# Patient Record
Sex: Female | Born: 1965 | Race: White | Hispanic: No | Marital: Married | Smoking: Current every day smoker
Health system: Southern US, Community
[De-identification: ages and names within clinical notes are randomized; demographics above are authoritative.]

## PROBLEM LIST (undated history)

## (undated) DIAGNOSIS — M549 Dorsalgia, unspecified: Secondary | ICD-10-CM

## (undated) DIAGNOSIS — I471 Supraventricular tachycardia, unspecified: Secondary | ICD-10-CM

## (undated) DIAGNOSIS — I10 Essential (primary) hypertension: Secondary | ICD-10-CM

## (undated) DIAGNOSIS — I4891 Unspecified atrial fibrillation: Secondary | ICD-10-CM

## (undated) DIAGNOSIS — J189 Pneumonia, unspecified organism: Secondary | ICD-10-CM

## (undated) DIAGNOSIS — K5792 Diverticulitis of intestine, part unspecified, without perforation or abscess without bleeding: Secondary | ICD-10-CM

## (undated) HISTORY — PX: TUBAL LIGATION: SHX77

## (undated) HISTORY — PX: CHOLECYSTECTOMY: SHX55

---

## 2002-05-01 ENCOUNTER — Emergency Department (HOSPITAL_COMMUNITY): Admission: EM | Admit: 2002-05-01 | Discharge: 2002-05-01 | Payer: Self-pay | Admitting: Internal Medicine

## 2002-05-01 ENCOUNTER — Emergency Department (HOSPITAL_COMMUNITY): Admission: EM | Admit: 2002-05-01 | Discharge: 2002-05-01 | Payer: Self-pay | Admitting: *Deleted

## 2002-06-06 ENCOUNTER — Emergency Department (HOSPITAL_COMMUNITY): Admission: EM | Admit: 2002-06-06 | Discharge: 2002-06-06 | Payer: Self-pay | Admitting: *Deleted

## 2002-08-12 ENCOUNTER — Ambulatory Visit (HOSPITAL_COMMUNITY): Admission: RE | Admit: 2002-08-12 | Discharge: 2002-08-12 | Payer: Self-pay | Admitting: Family Medicine

## 2002-08-12 ENCOUNTER — Encounter: Payer: Self-pay | Admitting: Family Medicine

## 2002-08-16 ENCOUNTER — Emergency Department (HOSPITAL_COMMUNITY): Admission: EM | Admit: 2002-08-16 | Discharge: 2002-08-16 | Payer: Self-pay | Admitting: Emergency Medicine

## 2004-04-16 ENCOUNTER — Emergency Department (HOSPITAL_COMMUNITY): Admission: EM | Admit: 2004-04-16 | Discharge: 2004-04-16 | Payer: Self-pay | Admitting: *Deleted

## 2004-04-17 ENCOUNTER — Emergency Department (HOSPITAL_COMMUNITY): Admission: EM | Admit: 2004-04-17 | Discharge: 2004-04-18 | Payer: Self-pay | Admitting: *Deleted

## 2004-10-24 ENCOUNTER — Emergency Department (HOSPITAL_COMMUNITY): Admission: EM | Admit: 2004-10-24 | Discharge: 2004-10-24 | Payer: Self-pay | Admitting: Emergency Medicine

## 2005-03-17 ENCOUNTER — Emergency Department (HOSPITAL_COMMUNITY): Admission: EM | Admit: 2005-03-17 | Discharge: 2005-03-18 | Payer: Self-pay | Admitting: Emergency Medicine

## 2006-03-17 ENCOUNTER — Emergency Department (HOSPITAL_COMMUNITY): Admission: EM | Admit: 2006-03-17 | Discharge: 2006-03-17 | Payer: Self-pay | Admitting: Emergency Medicine

## 2007-01-24 ENCOUNTER — Inpatient Hospital Stay (HOSPITAL_COMMUNITY): Admission: EM | Admit: 2007-01-24 | Discharge: 2007-01-26 | Payer: Self-pay | Admitting: Emergency Medicine

## 2007-08-10 ENCOUNTER — Ambulatory Visit (HOSPITAL_COMMUNITY): Admission: RE | Admit: 2007-08-10 | Discharge: 2007-08-10 | Payer: Self-pay | Admitting: Orthopaedic Surgery

## 2008-01-03 ENCOUNTER — Emergency Department (HOSPITAL_COMMUNITY): Admission: EM | Admit: 2008-01-03 | Discharge: 2008-01-03 | Payer: Self-pay | Admitting: Emergency Medicine

## 2009-03-11 ENCOUNTER — Emergency Department (HOSPITAL_COMMUNITY): Admission: EM | Admit: 2009-03-11 | Discharge: 2009-03-11 | Payer: Self-pay | Admitting: Emergency Medicine

## 2010-05-02 ENCOUNTER — Emergency Department (HOSPITAL_COMMUNITY): Admission: EM | Admit: 2010-05-02 | Discharge: 2009-08-24 | Payer: Self-pay | Admitting: Emergency Medicine

## 2010-05-02 ENCOUNTER — Emergency Department (HOSPITAL_COMMUNITY): Admission: EM | Admit: 2010-05-02 | Discharge: 2009-11-01 | Payer: Self-pay | Admitting: Emergency Medicine

## 2010-05-14 ENCOUNTER — Emergency Department (HOSPITAL_COMMUNITY)
Admission: EM | Admit: 2010-05-14 | Discharge: 2010-05-15 | Payer: Self-pay | Source: Home / Self Care | Admitting: Emergency Medicine

## 2010-06-15 ENCOUNTER — Encounter: Payer: Self-pay | Admitting: *Deleted

## 2010-08-05 LAB — URINALYSIS, ROUTINE W REFLEX MICROSCOPIC
Bilirubin Urine: NEGATIVE
Glucose, UA: NEGATIVE mg/dL
Ketones, ur: NEGATIVE mg/dL
Leukocytes, UA: NEGATIVE
Nitrite: NEGATIVE
Protein, ur: NEGATIVE mg/dL
Specific Gravity, Urine: 1.025 (ref 1.005–1.030)
Urobilinogen, UA: 0.2 mg/dL (ref 0.0–1.0)
pH: 6 (ref 5.0–8.0)

## 2010-08-05 LAB — URINE MICROSCOPIC-ADD ON

## 2010-08-05 LAB — URINE CULTURE
Colony Count: NO GROWTH
Culture  Setup Time: 201112211125
Culture: NO GROWTH

## 2010-08-08 ENCOUNTER — Observation Stay (HOSPITAL_COMMUNITY)
Admission: EM | Admit: 2010-08-08 | Discharge: 2010-08-12 | Disposition: A | Discharge status: Home / Self Care | Payer: BC Managed Care – PPO | Attending: Internal Medicine | Admitting: Internal Medicine

## 2010-08-08 ENCOUNTER — Emergency Department (HOSPITAL_COMMUNITY): Payer: BC Managed Care – PPO

## 2010-08-08 DIAGNOSIS — T380X5A Adverse effect of glucocorticoids and synthetic analogues, initial encounter: Secondary | ICD-10-CM | POA: Insufficient documentation

## 2010-08-08 DIAGNOSIS — F17200 Nicotine dependence, unspecified, uncomplicated: Secondary | ICD-10-CM | POA: Insufficient documentation

## 2010-08-08 DIAGNOSIS — J209 Acute bronchitis, unspecified: Principal | ICD-10-CM | POA: Insufficient documentation

## 2010-08-08 DIAGNOSIS — R7309 Other abnormal glucose: Secondary | ICD-10-CM | POA: Insufficient documentation

## 2010-08-08 DIAGNOSIS — J440 Chronic obstructive pulmonary disease with acute lower respiratory infection: Principal | ICD-10-CM | POA: Insufficient documentation

## 2010-08-08 DIAGNOSIS — I10 Essential (primary) hypertension: Secondary | ICD-10-CM | POA: Insufficient documentation

## 2010-08-08 LAB — BLOOD GAS, ARTERIAL
Acid-Base Excess: 1.2 mmol/L (ref 0.0–2.0)
Bicarbonate: 25.2 mEq/L — ABNORMAL HIGH (ref 20.0–24.0)
FIO2: 21 %
O2 Saturation: 91.7 %
Patient temperature: 37
TCO2: 22.1 mmol/L (ref 0–100)
pCO2 arterial: 39.7 mmHg (ref 35.0–45.0)
pH, Arterial: 7.419 — ABNORMAL HIGH (ref 7.350–7.400)
pO2, Arterial: 62.4 mmHg — ABNORMAL LOW (ref 80.0–100.0)

## 2010-08-08 LAB — CBC
HCT: 42.1 % (ref 36.0–46.0)
Hemoglobin: 14.2 g/dL (ref 12.0–15.0)
MCH: 30.9 pg (ref 26.0–34.0)
MCHC: 33.7 g/dL (ref 30.0–36.0)
MCV: 91.7 fL (ref 78.0–100.0)
Platelets: 225 10*3/uL (ref 150–400)
RBC: 4.59 MIL/uL (ref 3.87–5.11)
RDW: 13.4 % (ref 11.5–15.5)
WBC: 15.4 10*3/uL — ABNORMAL HIGH (ref 4.0–10.5)

## 2010-08-08 LAB — BASIC METABOLIC PANEL
BUN: 10 mg/dL (ref 6–23)
CO2: 26 mEq/L (ref 19–32)
Calcium: 8.8 mg/dL (ref 8.4–10.5)
Chloride: 99 mEq/L (ref 96–112)
Creatinine, Ser: 0.57 mg/dL (ref 0.4–1.2)
GFR calc Af Amer: >60 mL/min (ref >60)
GFR calc non Af Amer: >60 mL/min (ref >60)
Glucose, Bld: 140 mg/dL — ABNORMAL HIGH (ref 70–99)
Potassium: 3.8 mEq/L (ref 3.5–5.1)
Sodium: 135 mEq/L (ref 135–145)

## 2010-08-09 LAB — DIFFERENTIAL
Basophils Absolute: 0 10*3/uL (ref 0.0–0.1)
Basophils Relative: 0 % (ref 0–1)
Eosinophils Absolute: 0.3 10*3/uL (ref 0.0–0.7)
Eosinophils Relative: 2 % (ref 0–5)
Lymphocytes Relative: 15 % (ref 12–46)
Lymphs Abs: 2.3 10*3/uL (ref 0.7–4.0)
Monocytes Absolute: 1.1 10*3/uL — ABNORMAL HIGH (ref 0.1–1.0)
Monocytes Relative: 7 % (ref 3–12)
Neutro Abs: 11.7 10*3/uL — ABNORMAL HIGH (ref 1.7–7.7)
Neutrophils Relative %: 76 % (ref 43–77)

## 2010-08-09 LAB — GLUCOSE, CAPILLARY
Glucose-Capillary: 267 mg/dL — ABNORMAL HIGH (ref 70–99)
Glucose-Capillary: 239 mg/dL — ABNORMAL HIGH (ref 70–99)
Glucose-Capillary: 227 mg/dL — ABNORMAL HIGH (ref 70–99)

## 2010-08-10 LAB — COMPREHENSIVE METABOLIC PANEL
ALT: 27 U/L (ref 0–35)
AST: 16 U/L (ref 0–37)
Albumin: 3.2 g/dL — ABNORMAL LOW (ref 3.5–5.2)
Alkaline Phosphatase: 104 U/L (ref 39–117)
BUN: 10 mg/dL (ref 6–23)
CO2: 27 mEq/L (ref 19–32)
Calcium: 9.1 mg/dL (ref 8.4–10.5)
Chloride: 104 mEq/L (ref 96–112)
Creatinine, Ser: 0.54 mg/dL (ref 0.4–1.2)
GFR calc Af Amer: >60 mL/min (ref >60)
GFR calc non Af Amer: >60 mL/min (ref >60)
Glucose, Bld: 164 mg/dL — ABNORMAL HIGH (ref 70–99)
Potassium: 4.5 mEq/L (ref 3.5–5.1)
Sodium: 139 mEq/L (ref 135–145)
Total Bilirubin: 0.3 mg/dL (ref 0.3–1.2)
Total Protein: 6.6 g/dL (ref 6.0–8.3)

## 2010-08-10 LAB — CBC
HCT: 43.9 % (ref 36.0–46.0)
Hemoglobin: 14.3 g/dL (ref 12.0–15.0)
MCH: 31.4 pg (ref 26.0–34.0)
MCHC: 32.6 g/dL (ref 30.0–36.0)
MCV: 96.5 fL (ref 78.0–100.0)
Platelets: 265 10*3/uL (ref 150–400)
RBC: 4.55 MIL/uL (ref 3.87–5.11)
RDW: 13.3 % (ref 11.5–15.5)
WBC: 21.7 10*3/uL — ABNORMAL HIGH (ref 4.0–10.5)

## 2010-08-10 LAB — DIFFERENTIAL
Basophils Absolute: 0 10*3/uL (ref 0.0–0.1)
Basophils Relative: 0 % (ref 0–1)
Eosinophils Absolute: 0 10*3/uL (ref 0.0–0.7)
Eosinophils Relative: 0 % (ref 0–5)
Lymphocytes Relative: 13 % (ref 12–46)
Lymphs Abs: 2.9 10*3/uL (ref 0.7–4.0)
Monocytes Absolute: 1.5 10*3/uL — ABNORMAL HIGH (ref 0.1–1.0)
Monocytes Relative: 7 % (ref 3–12)
Neutro Abs: 17.3 10*3/uL — ABNORMAL HIGH (ref 1.7–7.7)
Neutrophils Relative %: 80 % — ABNORMAL HIGH (ref 43–77)

## 2010-08-10 LAB — TSH: TSH: 0.289 u[IU]/mL — ABNORMAL LOW (ref 0.350–4.500)

## 2010-08-10 LAB — T4, FREE: Free T4: 0.95 ng/dL (ref 0.80–1.80)

## 2010-08-10 LAB — HEMOGLOBIN A1C
Hgb A1c MFr Bld: 6 % — ABNORMAL HIGH (ref <5.7)
Mean Plasma Glucose: 126 mg/dL — ABNORMAL HIGH (ref <117)

## 2010-08-10 LAB — GLUCOSE, CAPILLARY
Glucose-Capillary: 180 mg/dL — ABNORMAL HIGH (ref 70–99)
Glucose-Capillary: 232 mg/dL — ABNORMAL HIGH (ref 70–99)

## 2010-08-11 LAB — DIFFERENTIAL
Basophils Absolute: 0 10*3/uL (ref 0.0–0.1)
Eosinophils Absolute: 0 10*3/uL (ref 0.0–0.7)
Lymphocytes Relative: 19 % (ref 12–46)
Monocytes Relative: 8 % (ref 3–12)
Neutro Abs: 15.6 10*3/uL — ABNORMAL HIGH (ref 1.7–7.7)
Neutrophils Relative %: 73 % (ref 43–77)

## 2010-08-11 LAB — BASIC METABOLIC PANEL
CO2: 28 mEq/L (ref 19–32)
Calcium: 8.6 mg/dL (ref 8.4–10.5)
Chloride: 102 mEq/L (ref 96–112)
GFR calc Af Amer: >60 mL/min (ref >60)
Glucose, Bld: 148 mg/dL — ABNORMAL HIGH (ref 70–99)
Potassium: 3.9 mEq/L (ref 3.5–5.1)
Sodium: 137 mEq/L (ref 135–145)

## 2010-08-11 LAB — GLUCOSE, CAPILLARY
Glucose-Capillary: 245 mg/dL — ABNORMAL HIGH (ref 70–99)
Glucose-Capillary: 232 mg/dL — ABNORMAL HIGH (ref 70–99)

## 2010-08-11 LAB — CBC
HCT: 42.7 % (ref 36.0–46.0)
Hemoglobin: 13.8 g/dL (ref 12.0–15.0)
RBC: 4.42 MIL/uL (ref 3.87–5.11)
WBC: 21.3 10*3/uL — ABNORMAL HIGH (ref 4.0–10.5)

## 2010-08-12 LAB — URINALYSIS, ROUTINE W REFLEX MICROSCOPIC
Bilirubin Urine: NEGATIVE
Glucose, UA: NEGATIVE mg/dL
Leukocytes, UA: NEGATIVE
Nitrite: NEGATIVE
Specific Gravity, Urine: 1.025 (ref 1.005–1.030)
pH: 6 (ref 5.0–8.0)

## 2010-08-12 LAB — URINE CULTURE

## 2010-08-12 LAB — POCT PREGNANCY, URINE: Preg Test, Ur: NEGATIVE

## 2010-08-12 LAB — URINE MICROSCOPIC-ADD ON

## 2010-08-12 LAB — GLUCOSE, CAPILLARY: Glucose-Capillary: 110 mg/dL — ABNORMAL HIGH (ref 70–99)

## 2010-08-14 LAB — URINALYSIS, ROUTINE W REFLEX MICROSCOPIC
Leukocytes, UA: NEGATIVE
Protein, ur: NEGATIVE mg/dL
Specific Gravity, Urine: 1.02 (ref 1.005–1.030)
Urobilinogen, UA: 0.2 mg/dL (ref 0.0–1.0)

## 2010-08-14 LAB — URINE MICROSCOPIC-ADD ON

## 2010-08-14 LAB — URINE CULTURE

## 2010-08-29 LAB — URINALYSIS, ROUTINE W REFLEX MICROSCOPIC
Leukocytes, UA: NEGATIVE
Nitrite: NEGATIVE
Protein, ur: NEGATIVE mg/dL
Specific Gravity, Urine: >1.03 — ABNORMAL HIGH (ref 1.005–1.030)
Urobilinogen, UA: 0.2 mg/dL (ref 0.0–1.0)

## 2010-08-29 LAB — URINE MICROSCOPIC-ADD ON

## 2010-08-29 LAB — PREGNANCY, URINE: Preg Test, Ur: NEGATIVE

## 2010-09-05 NOTE — H&P (Signed)
Wendy Ramirez, Wendy Ramirez               ACCOUNT NO.:  [id number]  MEDICAL RECORD NO.:  [id number]           PATIENT TYPE:  LOCATION:                                 FACILITY:  PHYSICIAN:  Osvaldo Shipper, MD     DATE OF BIRTH:  [birth year]  DATE OF ADMISSION:  08/09/2010 DATE OF DISCHARGE:  LH                             HISTORY & PHYSICAL   PRIMARY CARE PHYSICIAN:  Social research officer, government.  ADMISSION DIAGNOSES: 1. Acute bronchitis. 2. Chronic back pain.  CHIEF COMPLAINT:  Cough, shortness of breath since Sunday.  HISTORY OF PRESENT ILLNESS:  The patient is a 45 year old obese Caucasian female, who smokes, who does not have any other medical issues except for chronic back pain, who was in her usual state of health until this past weekend and she started having flu-like symptoms with runny nose, cough, body aches and then on Sunday, she started having shortness of breath with wheezing, had a cough with tan-colored expectoration. Denies any blood.  She had some sweating but is unsure if she had any fever.  Denies any sick contacts or any travel outside this area. Because of the coughing and the shortness of breath, she also experienced some chest tightness.  She also has been wheezing quite profusely.  With the breathing treatments given in the ED, she is feeling some better.  MEDICATIONS AT HOME:  Vicodin and Soma as needed.  ALLERGIES:  No known drug allergies.  PAST MEDICAL HISTORY:  Positive for: 1. Chronic back pain. 2. C-section. 3. Cholecystectomy.  SOCIAL HISTORY:  Lives in [place], smokes one pack of cigarettes on a daily basis.  No alcohol use.  No illicit drug use.  Currently unemployed.  FAMILY HISTORY:  Positive for unspecified heart disease.  REVIEW OF SYSTEMS:  GENERAL:  Positive for weakness, malaise.  HEENT: Unremarkable.  CARDIOVASCULAR:  As in HPI.  RESPIRATORY:  As in HPI. GASTROINTESTINAL:  Unremarkable.  GENITOURINARY:  Unremarkable. NEUROLOGIC:   Unremarkable.  PSYCHIATRIC:  Unremarkable.  DERMATOLOGIC: Unremarkable.  Other systems reviewed and found to be negative.  PHYSICAL EXAMINATION:  VITAL SIGNS:  Temperature 98.9; blood pressure 139/70; heart rate 113; respiratory rate 20; saturation 90% on room air, it was 88% on room air as well. GENERAL:  Morbidly obese white female in no distress. HEENT:  Head is normocephalic, atraumatic.  Pupils are equal and reacting.  No pallor, no icterus.  Oral mucous membranes moist.  No oral lesions are noted. NECK:  Soft and supple.  No thyromegaly appreciated.  No cervical, supraclavicular, or inguinal lymphadenopathy is present. LUNGS:  Diffuse end-expiratory wheezing bilaterally.  No crackles are present. CARDIOVASCULAR:  S1 and S2 are normal, regular.  No S3, S4, rubs, murmurs, or bruits. ABDOMEN:  Soft, nontender, nondistended.  Bowel sounds are present.  No masses or organomegaly is appreciated. EXTREMITIES:  No pedal edema is noted. NEUROLOGIC:  She is alert and oriented x3.  No focal neurological deficits are present. SKIN:  Does not reveal any rashes.  LABORATORY DATA:  Her ABG showed a pH of 7.41, pCO2 is 39, pO2 is 62, bicarb is 25, saturation 91%, this  was on room air.  White cell count is 15.4, hemoglobin is 14.2, platelet count is 225.  Electrolytes are normal.  Glucose was 140.  Chest x-ray was done which showed no active cardiopulmonary disease.  ASSESSMENT:  This is a 45 year old Caucasian female with no medical history except for chronic back pain who presents with wheezing, shortness of breath, cough for the past 3-4 days.  This is most likely acute bronchitis.  She unfortunately smokes.  PLAN: 1. Acute bronchitis will be treated with nebulizer treatments,     steroids, and azithromycin.  The smoking cessation will be     emphasized. 2. Tobacco abuse.  Nicotine patch will be utilized. 3. Chronic back pain.  Continue Vicodin as needed. 4. Leukocytosis, etiology  unclear.  We will recheck in the morning.     She is afebrile.  DVT prophylaxis will be initiated.  Further management and decisions will depend on results of further testing and patient's response to treatment.  Please also note that we have put the patient on antitussive agents.     Osvaldo Shipper, MD     GK/MEDQ  D:  08/09/2010  T:  08/09/2010  Job:  [id number]  cc:   Corrie Mckusick, M.D. Fax: [phone]  Electronically Signed by Osvaldo Shipper MD on 09/05/2010 11:00:29 PM

## 2010-09-11 NOTE — Discharge Summary (Signed)
NAMEJAYA, Wendy Ramirez               ACCOUNT NO.:  [id number]  MEDICAL RECORD NO.:  [id number]          PATIENT TYPE:  LOCATION:                                 FACILITY:  PHYSICIAN:  Annslee Tercero L. Lendell Caprice, MDDATE OF BIRTH:  1965-08-27  DATE OF ADMISSION:  08/08/2010 DATE OF DISCHARGE:  03/19/2012LH                              DISCHARGE SUMMARY   PRIMARY CARE PHYSICIAN:  Social research officer, government.  DISCHARGE DIAGNOSES: 1. Acute bronchitis with chronic obstructive pulmonary disease     exacerbation. 2. Hypertension. 3. Steroid-induced hyperglycemia. 4. Tobacco abuse. 5. Morbid obesity.  DISCHARGE MEDICATIONS: 1. Ambien 5 mg tab 1 tablet p.o. at bedtime p.r.n. insomnia dispense     10. 2. Zithromax 250 mg tab p.o. daily x1 more day to complete course. 3. Prednisone taper. 4. Hydrocodone -- APAP 7.5/325 one tablet p.o. q.4 h. p.r.n. pain. 5. Soma 350 mg tab 1 tablet p.o. t.i.d. p.r.n. muscle spasm.  BRIEF HOSPITAL COURSE:  Wendy Ramirez is a 45 year old morbidly obese female who has history of chronic back pain and chronic tobacco abuse who presented to the emergency department on day of admission with complaints of cough and shortness of breath.  The patient described flu- like symptoms with runny nose, cough and body aches.  Upon evaluation in the emergency department, the patient found to have significant wheezing which was improved with nebulizer treatments.  Given persistence of the patient's symptoms, she was admitted by [facility] for further evaluation and treatment.  DIAGNOSTIC STUDIES:  Chest x-ray performed on August 08, 2010 showing no active disease.  CONSULTATIONS:  None.  PERTINENT LABS:  At time of discharge, white cell count 21.3 down from 21.7, platelet count 271, hemoglobin 13.8, hematocrit 42.7.  Sodium 137, potassium 3.9, chloride 102, CO2 of 28, BUN 15, creatinine 0.57, TSH 0.289, free T4 within normal limits.  Hemoglobin A1c 6.0.  COURSE OF  HOSPITALIZATION: 1. Acute bronchitis with chronic obstructive pulmonary disease     exacerbation.  The patient was admitted from the emergency     department and placed on IV Solu-Medrol and azithromycin.  The     patient has since been tapered to p.o. prednisone.  She has     tolerated taper well with no wheezing at time of discharge     evaluation.  The patient is instructed to continue prednisone taper     as prescribed as well as complete 5-day course of azithromycin.     The patient has been counseled extensively on smoking cessation.     She has expressed interest in nicotine patch, which can be brought     over-the-counter. 2. Hypertension.  The patient's blood pressure remained relatively     stable throughout hospitalization.  She did have slight elevation     in blood pressure on day of disposition. The patient has been asked     to follow up in 1-2 weeks per primary care physician for recheck of     blood pressure and determine need for treatment. 3. Steroid-induced hyperglycemia.  As mentioned above, hemoglobin A1c     during hospitalization is 6.0.  The patient  was maintained on     sliding scale insulin throughout hospitalization.  No need for     treatment at time of discharge.  DISPOSITION:  The patient is felt medically stable for discharge home at this time.  Again, the patient has been discharged with steroid taper and p.o. Zithromax.  The patient is instructed to resume heart-healthy diet.  She is asked to follow with primary care physician in 1-2 weeks for recheck of acute bronchitis as well as recheck of blood pressure. The patient verifies understanding of discharge instructions.     Cordelia Pen, NP   ______________________________ Hedy Camara. Lendell Caprice, MD    LE/MEDQ  D:  08/12/2010  T:  08/12/2010  Job:  [id number]  cc:   [facility]  Electronically Signed by Cordelia Pen NP on 08/16/2010 10:35:15 AM Electronically Signed by  Crista Curb MD on 09/11/2010 09:27:25 PM

## 2010-10-08 NOTE — H&P (Signed)
NAME:  Wendy Ramirez, Wendy Ramirez               ACCOUNT NO.:  [id number]   MEDICAL RECORD NO.:  [id number]          PATIENT TYPE:  INP   LOCATION:  A332                          FACILITY:  [facility]   PHYSICIAN:  Catalina Pizza, M.D.        DATE OF BIRTH:  Oct 10, 1965   DATE OF ADMISSION:  01/24/2007  DATE OF DISCHARGE:  LH                              HISTORY & PHYSICAL   PRIMARY DOCTOR:  [facility]   CHIEF COMPLAINT:  Abdominal pain, low internal.   HISTORY OF PRESENT ILLNESS:  Ms. Slappey is a morbidly obese 45 year old  white female who stated that she began having abdominal pain  approximately two days prior to coming into the emergency department,  relatively constant, deep type pain.  Did not have any relief with any  routine medications.  She relates the pain as being suprapubic area.  She was seen by the emergency room physicians and had full workup with a  mildly elevated white count and CT which revealed findings consistent  with diverticulitis.  Per her report, she states that she had the same  type pain approximately six months ago and received some antibiotics  which did relieve all of her symptoms at that time.  She has not had any  other significant complaints at this time.  Other than that, no problems  with bowel or bladder, although she states she always has loose type  stools anyway.  Has not noticed any blood in her stools, black tarry  stools.   PAST MEDICAL HISTORY:  Significant for chronic lower back pain.  She has  had a gallbladder surgery before as well as tonsillectomy.   SOCIAL HISTORY:  She has four children, takes care of them.  She does  smoke approximately one pack per day.  She quit for eight years but  resumed smoking again recently.  Does not drink alcohol or any other  drugs.   FAMILY HISTORY:  Father died at age 54 of cirrhosis and heart disease.   Mother is 36.  She does have history of kidney stones and history of hip  replacement.   Has one brother and  four sisters.  One brother is deceased from a car  accident.  No other health problems in brother and sister.   REVIEW OF SYSTEMS:  The patient does not notice any specific fever or  chills recently but does note the lower abdominal pain as in the history  of present illness.  Denies any chest pains.  No problems with her  breathing.  No problems with urinary tract symptoms.  She denies any  vaginal bleeding or vaginal pain.  No joint pain specifically other than  some back pain which is chronic.  No other rashes apparent.   MEDICATIONS:  She is on Celebrex 100 mg p.o. daily which she takes  p.r.n. and Vicodin 7.5/500 q.6h. p.r.n. for pain.   ALLERGIES:  NO KNOWN DRUG ALLERGIES.   OBJECTIVE FINDINGS:  VITALS:  Initial temperature was 99, blood pressure  116/66, pulse 112, respirations 16, satting 96% on room air.  GENERAL: This is  a morbidly obese white female lying in bed in no acute  distress.  HEENT:  Unremarkable.  Pupils equal and react to light accommodation.  No scleral icterus.  NECK:  Supple.  HEART:  Regular rate and rhythm.  No murmurs, gallops, or rubs.  LUNGS: Clear to auscultation bilaterally.  ABDOMEN:  Soft, nontender.  Unable to palpate pain that she is having  even in the suprapubic area.  EXTREMITIES:  No lower extremity edema.  NEUROLOGIC:  Alert and oriented x3 and no deficits noted.   LAB WORK ON ADMISSION:  UA shows some dehydration with specific gravity  of greater than 1.030, but otherwise negative.  A urine micro showed 7  out of 10 WBCs and RBCs and few bacteria.  Pregnancy was negative.  CBC  showed white count of 15.6, hemoglobin 14.6, platelet count of 247 with  left shift, 81% neutrophils.  B-met shows sodium 137, potassium 4.3,  chloride 105, CO2 26, glucose 100, BUN 10, creatinine 0.59, calcium of  8.6.  Wet prep was done which showed no yeast.  No Trichomonas but many  clue cells, many WBCs.   CT scan noted to have inflammatory changes about  sigmoid colon  suggesting diverticulitis.  Also did note a 6.8-cm left ovarian cystic  mass which is similar to a study in 2005, but I do not believe that  there is any interval follow-up from this.   IMPRESSION:  This is a 45 year old morbidly obese white female who  appears to have some simple diverticulitis type pain.  No significant  signs of bleeding or abscess on CT scan.   ASSESSMENT/PLAN:  1. Diverticulitis.  Cover with Flagyl and Cipro IV for now, and we      will transition over to oral medicines.  She did have elevated      white count at 15,000.  We will recheck CBC, make sure that this is      coming down, and assess her pain.  We will continue pain medication      IV for now.  I do not feel that a GI consult is necessary at this      time.  2. Bacterial vaginosis.  She did have significant clue cells, and the      Flagyl which she is getting, should cover this as well.  3. Left ovarian cyst/tumor.  We will get OB/GYN to see the patient to      assure that she gets routine follow-up for this since this has been      present for the last two years.  Unclear what kind of monitoring      need be done, whether surgery needs to be performed to further      assess this.  4. Morbid obesity.  Did discuss with the patient that risk factors of      having that much weight and problems that it can cause.  5. Tobacco abuse.  She continues to smoke.  I did talk to her about      quitting smoking at this time.   DISPOSITION:  We will continue with IV antibiotics for now and reassess  in the morning to see if any further tests need to be done.      Catalina Pizza, M.D.  Electronically Signed    ZH/MEDQ  D:  01/25/2007  T:  01/25/2007  Job:  [id number]

## 2010-10-08 NOTE — Discharge Summary (Signed)
NAMEMALEIGHA, Wendy Ramirez               ACCOUNT NO.:  [id number]   MEDICAL RECORD NO.:  [id number]          PATIENT TYPE:  INP   LOCATION:  A332                          FACILITY:  [facility]   PHYSICIAN:  Osvaldo Shipper, MD     DATE OF BIRTH:  [birth year]   DATE OF ADMISSION:  01/24/2007  DATE OF DISCHARGE:  09/02/2008LH                               DISCHARGE SUMMARY   Please see H&P dictated by Dr. Catalina Pizza for details regarding patient's  presenting illness.   PMD:  [employer].   DISCHARGE DIAGNOSES:  1. Acute diverticulitis, improved.  2. Left ovarian cyst requiring gynecologic followup.  3. Bacterial vaginosis, stable.  4. History of morbid obesity.   BRIEF HOSPITAL COURSE:  Briefly, this is a 45 year old Caucasian female  who presented with complaints of abdominal pain.  There was no history  of any nausea.  No diarrhea was present.  She underwent a CAT scan which  showed evidence for diverticulitis.   Patient was admitted to the hospital and put on Cipro and Flagyl.  Her  pain is improved, although it still persists.  She did have an elevated  white count of 15,000, which has come down to 9000 today.  The rest of  her labs are completely unremarkable.  Hence, from this perspective, the  patient is considered stable to be discharged.   Ovarian cyst was also noted on the CAT scan.  This was on the left side.  It was stable, compared to a previous study from 2005.  In order to  further evaluate this, we have consulted Dr. Emelda Fear.  He has ordered a  pelvic ultrasound on her today, the report of which is pending.  I think  these issues can be sorted out as an outpatient if it is okay with Dr.  Emelda Fear.  She can definitely follow up with him as an outpatient.  He  is going to see this patient this afternoon, and if okay with him, we  will go ahead and discharge the patient.   Bacterial vaginosis will be treated by the Flagyl that she will on.   Weight loss counseling and  smoking cessation counseling was provided by  the patient.   DISCHARGE MEDICATIONS:  1. She may continue Celebrex 10 mg daily.  2. She has been asked to stop Vicodin for now and instead be on      Percocet 5/325 1-2 tablets as needed every 4 hours for pain, #20      tablets have been prescribed.  3. Cipro 500 mg p.o. b.i.d. x12 days.  4. Flagyl 500 mg 3 times daily x12 days.   Follow up with [facility] in one months' time.  Patient may need  a referral to GI at that point to see if she needs a colonoscopy.  She  will also likely need a followup with Dr. Emelda Fear for this ovarian  cyst.   DIET:  Low fat, high fiber diet.   PHYSICAL ACTIVITY:  No restrictions.   IMAGING STUDIES:  CAT scan and ultrasound, discussed above.  The pelvic  ultrasound report is  still pending.   Total time of discharge 35 minutes.      Osvaldo Shipper, MD  Electronically Signed     GK/MEDQ  D:  01/26/2007  T:  01/26/2007  Job:  [id number]   cc:   Tilda Burrow, M.D.  Fax: [phone]   Patrica Duel, M.D.  Fax: [phone]

## 2010-10-08 NOTE — Consult Note (Signed)
Wendy Ramirez, Wendy Ramirez               ACCOUNT NO.:  [id number]   MEDICAL RECORD NO.:  [id number]          PATIENT TYPE:  INP   LOCATION:  A332                          FACILITY:  [facility]   PHYSICIAN:  Tilda Burrow, M.D. DATE OF BIRTH:  1965-06-10   DATE OF CONSULTATION:  01/26/2007  DATE OF DISCHARGE:                                 CONSULTATION   ADMISSION DIAGNOSES:  Diverticular disease, left ovarian tumor, stable.   HISTORY OF PRESENT ILLNESS:  This morbidly obese 45 year old female is  admitted as described by Dr. Catalina Pizza for diverticular disease after  being seen in the Emergency Room and admitted when placed on IV Flagyl  and Cipro with conversion to oral medications.  The patient was noted  during admitting CT scan to have a persistent 7 cm left ovarian cyst  essentially unchanged from 2005.   GYN history is interesting.  The patient is a morbidly obese, gravida 4,  para 4, status post tubal ligation.  Many years ago she had an episode  of severe anemia with admission for D&C due to hemoglobin of 3.7.  She  has had D&C on two occasions by Dr. Lisette Grinder during his care for the  patient.  She has had no bleeding problems in several years and has had  no GYN care of course during that time.  She said last Pap smear was  several years ago.   SOCIAL HISTORY:  She has four children.  She smokes one pack per day.  Denies recreational drugs.   FAMILY HISTORY:  Father died of cirrhosis and heart disease.  Mother  alive and well with various medical problems.   MEDICATIONS:  1. Celebrex 100 mg p.o. daily.  2. Vicodin 7.5/500 q. 6 hours p.r.n. pain.   At the time of this visit on 01/26/07, the patient is doing quite well and  is ready for discharge.  CA125 has been ordered and is pending at the  time of this dictation.  She has had a pelvic ultrasound this a.m. which  shows a 7 cm cyst essentially unchanged from 2005.  The patient denies  any symptoms at this point in time relate to  the cyst.  Nonetheless we  will follow her up in four weeks.  Some consideration will be given to  surgical removal of this  persistent cyst.  The patient may choose to have hysterectomy and  removal of ovary at the same time due to her history of fleeting  abnormalities.  Pap smear of course will be necessary along with GC and  Chlamydia culture at follow-up visit in our office in two to four weeks.      Tilda Burrow, M.D.  Electronically Signed     JVF/MEDQ  D:  01/26/2007  T:  01/26/2007  Job:  [id number]   cc:   Catalina Pizza, M.D.  Fax: [phone]   [facility]

## 2010-10-08 NOTE — Group Therapy Note (Signed)
NAME:  Wendy Ramirez, Wendy Ramirez               ACCOUNT NO.:  [id number]   MEDICAL RECORD NO.:  [id number]          PATIENT TYPE:  INP   LOCATION:  A332                          FACILITY:  [facility]   PHYSICIAN:  Catalina Pizza, M.D.        DATE OF BIRTH:  1965/10/08   DATE OF PROCEDURE:  DATE OF DISCHARGE:                                 PROGRESS NOTE   SUBJECTIVE:  Wendy Ramirez was admitted for diverticulitis with lower  abdominal pain.  She states that the pain medication that she has been  getting IV has worked well for her.  She states she has been continued  on a clear liquid diet since last night and tolerated it well without  any difficulty.  She rates the pain at approximately 4/10 at its worst.   PHYSICAL EXAMINATION:  VITAL SIGNS:  Temperature 98, blood pressure  109/67, pulse 113, respirations 18, O2 saturation 95% on room air.  GENERAL:  This is a morbidly obese white female in no acute distress.  HEENT:  Unremarkable.  LUNGS:  Clear to auscultation bilaterally.  HEART:  Regular rate and rhythm.  No murmurs, gallops or rubs.  ABDOMEN:  Soft, nontender, positive bowel sounds but very protuberant.  EXTREMITIES:  No lower extremity edema.  NEUROLOGIC:  No deficits noted.   LABORATORY DATA:  The CBC showed a white count of 13.1, hemoglobin 13.1,  platelet count of 200.  Basic metabolic panel was normal except mildly  elevated glucose at 117.   IMPRESSION:  This is a 45 year old morbidly obese white female with what  appeared to be diverticulitis on IV Flagyl and Cipro at this time.   ASSESSMENT/PLAN:  1. Diverticulitis:  Will continue with IV Flagyl and Cipro through      today and transition over to oral medicines tomorrow.  We will go      ahead an transition to oral pain medicines today from the Dilaudid      and see if she can tolerate this to help out with pain portion of      it.  She will likely need at least 10-14 days of antibiotics to      help with this.  2. Ovarian cyst/tumor:   Will get OB/GYN to consult on the patient to      at least assure that she has follow up for this lesion which she      has had approximately for the last 2-3 years.  I am unclear whether      this has remained stable but whether this needs to be worked up      further.  Defer to the consultant.  3. Tobacco abuse:  Counseled again on the patient quitting smoking.   DISPOSITION:  If tolerates pain medicine all right and recheck of  laboratory work in the morning is okay, the patient may be amenable to  discharge with follow up with her routine doctors at [facility].      Catalina Pizza, M.D.  Electronically Signed     ZH/MEDQ  D:  01/25/2007  T:  01/25/2007  Job:  [id number]

## 2010-11-14 ENCOUNTER — Emergency Department (HOSPITAL_COMMUNITY): Payer: BC Managed Care – PPO

## 2010-11-14 ENCOUNTER — Inpatient Hospital Stay (HOSPITAL_COMMUNITY)
Admission: EM | Admit: 2010-11-14 | Discharge: 2010-11-16 | DRG: 181 | Disposition: A | Discharge status: Home / Self Care | Payer: BC Managed Care – PPO | Attending: General Surgery | Admitting: General Surgery

## 2010-11-14 DIAGNOSIS — K56609 Unspecified intestinal obstruction, unspecified as to partial versus complete obstruction: Principal | ICD-10-CM | POA: Diagnosis present

## 2010-11-14 LAB — URINALYSIS, ROUTINE W REFLEX MICROSCOPIC
Bilirubin Urine: NEGATIVE
Ketones, ur: NEGATIVE mg/dL
Nitrite: NEGATIVE
Urobilinogen, UA: 0.2 mg/dL (ref 0.0–1.0)

## 2010-11-14 LAB — COMPREHENSIVE METABOLIC PANEL
Alkaline Phosphatase: 106 U/L (ref 39–117)
BUN: 11 mg/dL (ref 6–23)
Calcium: 10.2 mg/dL (ref 8.4–10.5)
Creatinine, Ser: 0.54 mg/dL (ref 0.50–1.10)
GFR calc Af Amer: >60 mL/min (ref >60)
Glucose, Bld: 101 mg/dL — ABNORMAL HIGH (ref 70–99)
Potassium: 4 mEq/L (ref 3.5–5.1)
Total Protein: 7.9 g/dL (ref 6.0–8.3)

## 2010-11-14 LAB — DIFFERENTIAL
Lymphocytes Relative: 23 % (ref 12–46)
Lymphs Abs: 2.7 10*3/uL (ref 0.7–4.0)
Monocytes Absolute: 0.7 10*3/uL (ref 0.1–1.0)
Monocytes Relative: 6 % (ref 3–12)
Neutro Abs: 7.7 10*3/uL (ref 1.7–7.7)

## 2010-11-14 LAB — LIPASE, BLOOD: Lipase: 35 U/L (ref 11–59)

## 2010-11-14 LAB — CBC
HCT: 46.8 % — ABNORMAL HIGH (ref 36.0–46.0)
Hemoglobin: 15.9 g/dL — ABNORMAL HIGH (ref 12.0–15.0)
MCH: 31.7 pg (ref 26.0–34.0)
MCHC: 34 g/dL (ref 30.0–36.0)
MCV: 93.4 fL (ref 78.0–100.0)

## 2010-11-14 MED ORDER — IOHEXOL 300 MG/ML  SOLN
120.0000 mL | Freq: Once | INTRAMUSCULAR | Status: AC | PRN
  Administered 2010-11-14: 120 mL via INTRAVENOUS

## 2010-11-15 LAB — BASIC METABOLIC PANEL
GFR calc Af Amer: >60 mL/min (ref >60)
GFR calc non Af Amer: >60 mL/min (ref >60)
Potassium: 4 mEq/L (ref 3.5–5.1)
Sodium: 138 mEq/L (ref 135–145)

## 2010-11-15 LAB — CBC
Hemoglobin: 14.1 g/dL (ref 12.0–15.0)
MCHC: 32.3 g/dL (ref 30.0–36.0)
RDW: 13.5 % (ref 11.5–15.5)

## 2010-11-15 LAB — DIFFERENTIAL
Basophils Absolute: 0 10*3/uL (ref 0.0–0.1)
Basophils Relative: 0 % (ref 0–1)
Monocytes Absolute: 0.8 10*3/uL (ref 0.1–1.0)
Neutro Abs: 10.2 10*3/uL — ABNORMAL HIGH (ref 1.7–7.7)

## 2010-12-29 ENCOUNTER — Emergency Department (HOSPITAL_COMMUNITY)
Admission: EM | Admit: 2010-12-29 | Discharge: 2010-12-30 | Disposition: A | Discharge status: Home / Self Care | Payer: BC Managed Care – PPO | Attending: Emergency Medicine | Admitting: Emergency Medicine

## 2010-12-29 ENCOUNTER — Encounter: Payer: Self-pay | Admitting: *Deleted

## 2010-12-29 DIAGNOSIS — I880 Nonspecific mesenteric lymphadenitis: Secondary | ICD-10-CM | POA: Insufficient documentation

## 2010-12-29 DIAGNOSIS — Z87891 Personal history of nicotine dependence: Secondary | ICD-10-CM | POA: Insufficient documentation

## 2010-12-29 DIAGNOSIS — I889 Nonspecific lymphadenitis, unspecified: Secondary | ICD-10-CM

## 2010-12-29 HISTORY — DX: Diverticulitis of intestine, part unspecified, without perforation or abscess without bleeding: K57.92

## 2010-12-29 HISTORY — DX: Pneumonia, unspecified organism: J18.9

## 2010-12-29 HISTORY — DX: Dorsalgia, unspecified: M54.9

## 2010-12-29 NOTE — ED Notes (Signed)
Pt reports multiple areas swelling in her head and neck starting 2 days ago

## 2010-12-30 MED ORDER — CEPHALEXIN 500 MG PO CAPS: 500.0000 mg | ORAL_CAPSULE | Freq: Four times a day (QID) | ORAL | Status: AC

## 2010-12-30 MED ORDER — SULFAMETHOXAZOLE-TRIMETHOPRIM 800-160 MG PO TABS: 1.0000 | ORAL_TABLET | Freq: Two times a day (BID) | ORAL | Status: AC

## 2010-12-30 MED ORDER — SULFAMETHOXAZOLE-TMP DS 800-160 MG PO TABS
1.0000 | ORAL_TABLET | Freq: Once | ORAL | Status: AC
  Administered 2010-12-30: 1 via ORAL
  Filled 2010-12-30: qty 1

## 2010-12-30 MED ORDER — CEPHALEXIN 500 MG PO CAPS
1000.0000 mg | ORAL_CAPSULE | Freq: Once | ORAL | Status: AC
  Administered 2010-12-30: 1000 mg via ORAL
  Filled 2010-12-30: qty 2

## 2010-12-30 MED ORDER — ONDANSETRON HCL 4 MG PO TABS
4.0000 mg | ORAL_TABLET | Freq: Once | ORAL | Status: AC
  Administered 2010-12-30: 4 mg via ORAL
  Filled 2010-12-30: qty 1

## 2010-12-30 MED ORDER — ONDANSETRON HCL 4 MG PO TABS: 4.0000 mg | ORAL_TABLET | Freq: Four times a day (QID) | ORAL | Status: AC

## 2010-12-30 NOTE — ED Notes (Signed)
H.Bryant in room with pt

## 2010-12-30 NOTE — ED Provider Notes (Signed)
History     CSN: [id number] Arrival date & time: 12/29/2010 11:19 PM  Chief Complaint  Patient presents with  . Lymphadenopathy   HPI Comments: Pt reports 2 lesions in the scalp since 8/3. She now has increasing lymph nodes through out the scalp area. Some are tender. She is not sure of scratching the scalp. Use uses hair dye and relaxers, but states she is using the same product as always. She denies fever, nausea, or fatigue. No reported drainage from any of the lesions of the scalp. The areas are not improved by anything. They are worsened by  Palpation. Pt has not taken any antibiotics or seen a MD concerning this problem.  The history is provided by the patient.    Past Medical History  Diagnosis Date  . Diverticulitis   . Pneumonia   . Back pain     Past Surgical History  Procedure Date  . Cholecystectomy     No family history on file.  History  Substance Use Topics  . Smoking status: Former Games developer  . Smokeless tobacco: Not on file  . Alcohol Use:     OB History    Grav Para Term Preterm Abortions TAB SAB Ect Mult Living                  Review of Systems  Constitutional: Negative for activity change.       All ROS Neg except as noted in HPI  HENT: Negative for nosebleeds and neck pain.   Eyes: Negative for photophobia and discharge.  Respiratory: Negative for cough, shortness of breath and wheezing.   Cardiovascular: Negative for chest pain and palpitations.  Gastrointestinal: Negative for abdominal pain and blood in stool.  Genitourinary: Negative for dysuria, frequency and hematuria.  Musculoskeletal: Negative for back pain and arthralgias.  Skin: Negative.   Neurological: Negative for dizziness, seizures and speech difficulty.  Psychiatric/Behavioral: Negative for hallucinations and confusion.    Physical Exam  BP 151/71  Pulse 105  Temp(Src) 98.2 F (36.8 C) (Oral)  Resp 18  Ht 5\' 7"  (1.702 m)  Wt 300 lb (136.079 kg)  BMI 46.99 kg/m2  SpO2 98%   LMP 12/26/2010  Physical Exam  Nursing note and vitals reviewed. Constitutional: She is oriented to person, place, and time. She appears well-developed and well-nourished.  Non-toxic appearance.  HENT:  Right Ear: Tympanic membrane and external ear normal.  Left Ear: Tympanic membrane and external ear normal.       2 palpable lesion of the right lateral scalp and occipital area. Multiple swollen nodes about the hairline and behind the ears.  Eyes: EOM and lids are normal. Pupils are equal, round, and reactive to light.  Neck: Normal range of motion. Neck supple. Carotid bruit is not present.  Cardiovascular: Normal rate, regular rhythm, normal heart sounds, intact distal pulses and normal pulses.   Pulmonary/Chest: Breath sounds normal. No respiratory distress.  Abdominal: Soft. Bowel sounds are normal. There is no tenderness. There is no guarding.  Musculoskeletal: Normal range of motion.  Lymphadenopathy:       Head (right side): No submandibular adenopathy present.       Head (left side): No submandibular adenopathy present.    She has no cervical adenopathy.  Neurological: She is alert and oriented to person, place, and time. She has normal strength. No cranial nerve deficit or sensory deficit.  Skin: Skin is warm and dry.  Psychiatric: She has a normal mood and affect. Her speech is  normal.    ED Course  Procedures  MDM I have reviewed nursing notes, vital signs, and all appropriate lab and imaging results for this patient.      Kathie Dike, [place] 12/30/10 0021 Medical screening examination/treatment/procedure(s) were performed by non-physician practitioner and as supervising physician I was immediately available for consultation/collaboration.  Nicoletta Dress. Colon Branch, MD 01/01/11 1207

## 2011-02-21 LAB — URINALYSIS, ROUTINE W REFLEX MICROSCOPIC
Leukocytes, UA: NEGATIVE
Urobilinogen, UA: 0.2

## 2011-02-21 LAB — PREGNANCY, URINE: Preg Test, Ur: NEGATIVE

## 2011-02-21 LAB — URINE MICROSCOPIC-ADD ON

## 2011-02-21 LAB — URINE CULTURE
Colony Count: NO GROWTH
Culture: NO GROWTH

## 2011-03-07 LAB — GC/CHLAMYDIA PROBE AMP, GENITAL
Chlamydia, DNA Probe: NEGATIVE
GC Probe Amp, Genital: NEGATIVE

## 2011-03-07 LAB — URINE MICROSCOPIC-ADD ON

## 2011-03-07 LAB — CBC
HCT: 36.3
Hemoglobin: 12.3
Hemoglobin: 13.1
MCHC: 33.9
MCV: 91.8
MCV: 91.5
Platelets: 247
RBC: 4.25
RBC: 4.65
RDW: 13.2
RDW: 13.3
WBC: 15.6 — ABNORMAL HIGH

## 2011-03-07 LAB — DIFFERENTIAL
Basophils Absolute: 0
Basophils Absolute: 0
Basophils Relative: 1
Eosinophils Absolute: 0.2
Eosinophils Relative: 4
Lymphocytes Relative: 15
Lymphocytes Relative: 12
Lymphs Abs: 1.9
Monocytes Absolute: 0.7
Monocytes Absolute: 1 — ABNORMAL HIGH
Monocytes Relative: 5
Neutro Abs: 9.8 — ABNORMAL HIGH
Neutrophils Relative %: 75
Neutrophils Relative %: 81 — ABNORMAL HIGH

## 2011-03-07 LAB — URINALYSIS, ROUTINE W REFLEX MICROSCOPIC
Glucose, UA: NEGATIVE
Ketones, ur: NEGATIVE
Protein, ur: NEGATIVE
pH: 5.5

## 2011-03-07 LAB — BASIC METABOLIC PANEL
BUN: 3 — ABNORMAL LOW
Calcium: 8.1 — ABNORMAL LOW
Chloride: 109
Chloride: 105
Creatinine, Ser: 0.63
Creatinine, Ser: 0.59
GFR calc Af Amer: >60
GFR calc Af Amer: >60
GFR calc non Af Amer: >60
GFR calc non Af Amer: >60
GFR calc non Af Amer: >60
Glucose, Bld: 117 — ABNORMAL HIGH
Potassium: 3.8
Potassium: 4.3
Sodium: 140
Sodium: 140

## 2011-03-07 LAB — RPR: RPR Ser Ql: NONREACTIVE

## 2011-03-07 LAB — WET PREP, GENITAL
Trich, Wet Prep: NONE SEEN
Yeast Wet Prep HPF POC: NONE SEEN

## 2011-03-31 NOTE — H&P (Signed)
NAMESAXON, CROSBY               ACCOUNT NO.:  [id number]  MEDICAL RECORD NO.:  [id number]  LOCATION:  A317                          FACILITY:  [facility]  PHYSICIAN:  Tilford Pillar, MD      DATE OF BIRTH:  11/18/1965  DATE OF ADMISSION:  11/14/2010 DATE OF DISCHARGE:  06/23/2012LH                             HISTORY & PHYSICAL   CHIEF COMPLAINT:  Abdominal pain.  HISTORY OF PRESENT ILLNESS:  The patient is a 45 year old female who presented to [facility] Emergency Department with less than 24 hours of abdominal pain, bloating, some nausea.  She denied any fevers or chills. No change in bowel function.  No melena, no hematochezia.  She has been passing flatus.  No dysuria.  She has had no episodes of emesis.  Nausea is actually improved upon presentation to the emergency department.  She describes the pain as colicky in nature.  It is increased with movements and palpation.  She has not had any relieving factors.  She has had no abnormal travel or food exposures.  No sick contacts.  PAST MEDICAL HISTORY:  Chronic back pain.  PAST SURGICAL HISTORY:  History of cesarean section and D and C.  MEDICATIONS:  Vicodin and Soma.  ALLERGIES:  No known drug allergies.  SOCIAL HISTORY:  She is approximately half a pack per day smoker.  No alcohol use.  No recreational drug abuse.  FAMILY HISTORY:  Noncontributory.  REVIEW OF SYSTEMS:  CONSTITUTIONAL:  Unremarkable.  EYES:  Unremarkable. EARS, NOSE, AND THROAT:  Unremarkable.  RESPIRATORY:  Unremarkable. CARDIOVASCULAR:  Unremarkable.  GASTROINTESTINAL:  As per HPI. GENITOURINARY:  Unremarkable.  MUSCULOSKELETAL:  Unremarkable. ENDOCRINE:  Unremarkable.  SKIN:  Unremarkable.  NEURO:  Unremarkable.  PHYSICAL EXAMINATION:  VITAL SIGNS:  Temperature 97.9, heart rate 100, respirations 18, blood pressure 150/75, she has 98% O2 saturation on room air. GENERAL:  She appears uncomfortable, but not in any acute distress.  She is alert and  oriented x3. HEENT:  Scalp, no deformities or masses.  Eyes, pupils equal, round, reactive.  Extraocular movements intact.  No scleral icterus.  No conjunctival pallor is noted.  Oral mucosa pink.  Normal occlusion. NECK:  Trachea is midline.  No cervical lymphadenopathy. PULMONARY:  Unlabored respiration.  She is clear to auscultation bilaterally. CARDIOVASCULAR:  Regular rate and rhythm.  No murmurs or gallops. ABDOMEN:  Positive bowel sounds.  Abdomen is soft, mildly distended. Mild-to-moderate suprapubic tenderness.  No diffuse peritoneal signs. No hernias or masses. EXTREMITIES:  Warm and dry.  PERTINENT LABORATORY AND RADIOGRAPHIC STUDIES:  Urinalysis demonstrates positive bacteria and is positive for blood, leukocyte esterase is negative however.  WBC count is 21.4, hemoglobin 15.9, hematocrit 46.8, platelets 234.  CT of the abdomen and pelvis demonstrates no focal findings, no free air or free fluid.  No intra-abdominal abnormalities. Basic metabolic panel is within normal limits.  ASSESSMENT/PLAN:  Ileus versus partial small bowel obstruction.  At this point, my suspicion lies high towards a ileus-type presentation likely secondarily to urinary tract infection.  She will be started on antibiotics to cover this as a potential etiology.  Continue IV fluid hydration as the patient is obviously dehydrated.  We  will continue on bowel rest.  Surgical indications were discussed with the patient.  She understands these indications and we will continue conservative management.     Tilford Pillar, MD     BZ/MEDQ  D:  03/31/2011  T:  03/31/2011  Job:  [id number]

## 2011-03-31 NOTE — Discharge Summary (Signed)
NAMEAUBURN, Wendy Ramirez               ACCOUNT NO.:  [id number]  MEDICAL RECORD NO.:  [id number]  LOCATION:  A317                          FACILITY:  [facility]  PHYSICIAN:  Tilford Pillar, MD      DATE OF BIRTH:  1965/07/27  DATE OF ADMISSION:  11/14/2010 DATE OF DISCHARGE:  06/23/2012LH                              DISCHARGE SUMMARY   ADMISSION DIAGNOSIS:  Ileus versus partial small bowel obstruction.  DISCHARGE DIAGNOSES: 1. Resolution of ileus. 2. Hypertension. 3. Chronic back pain.  DISPOSITION:  Home.  BRIEF H AND P:  Please see admission history and physical for the complete H and P.  The patient is a 45 year old female who presented to [facility] Emergency Department with increasing abdominal bloating, distention, and pain.  The patient during her workup was noted to be dehydrated and etiology was suspicious for ileus likely secondarily to urinary tract infection versus early partial small bowel obstruction.  The patient was admitted for planned management and intervention.  HOSPITAL COURSE:  The patient was admitted.  She was continued on IV fluid, hydration, and bowel rest.  Her symptomatology is actually improved over the next 24 hours and she was started back on a clear liquid diet.  On November 16, 2010, the patient was tolerating a regular diet.  Her symptomatology has resolved.  She had resumption of normal bowel function, and she was switched to oral antibiotics for suspected urinary tract infection.  Plans were made for discharge.  DISCHARGE INSTRUCTIONS:  The patient was instructed to increase activity as tolerated.  She is to call my office should she have any questions, problems, or concerns.  She is to follow up in my office as needed.  She is to continue her antibiotics as discussed.  DISCHARGE MEDICATIONS:  Please see the discharge reconciliation sheet for all discharge medications.     Tilford Pillar, MD     BZ/MEDQ  D:  03/31/2011  T:  03/31/2011  Job:   [id number]

## 2013-06-22 ENCOUNTER — Emergency Department (HOSPITAL_COMMUNITY)
Admission: EM | Admit: 2013-06-22 | Discharge: 2013-06-22 | Disposition: A | Discharge status: Home / Self Care | Payer: Managed Care, Other (non HMO) | Attending: Emergency Medicine | Admitting: Emergency Medicine

## 2013-06-22 ENCOUNTER — Encounter (HOSPITAL_COMMUNITY): Payer: Self-pay | Admitting: Emergency Medicine

## 2013-06-22 DIAGNOSIS — Z791 Long term (current) use of non-steroidal anti-inflammatories (NSAID): Secondary | ICD-10-CM | POA: Insufficient documentation

## 2013-06-22 DIAGNOSIS — F17200 Nicotine dependence, unspecified, uncomplicated: Secondary | ICD-10-CM | POA: Insufficient documentation

## 2013-06-22 DIAGNOSIS — Z8719 Personal history of other diseases of the digestive system: Secondary | ICD-10-CM | POA: Insufficient documentation

## 2013-06-22 DIAGNOSIS — Z8701 Personal history of pneumonia (recurrent): Secondary | ICD-10-CM | POA: Insufficient documentation

## 2013-06-22 DIAGNOSIS — Z8739 Personal history of other diseases of the musculoskeletal system and connective tissue: Secondary | ICD-10-CM | POA: Insufficient documentation

## 2013-06-22 DIAGNOSIS — L03317 Cellulitis of buttock: Secondary | ICD-10-CM

## 2013-06-22 DIAGNOSIS — L0231 Cutaneous abscess of buttock: Secondary | ICD-10-CM | POA: Insufficient documentation

## 2013-06-22 MED ORDER — SULFAMETHOXAZOLE-TRIMETHOPRIM 800-160 MG PO TABS: 1.0000 | ORAL_TABLET | Freq: Two times a day (BID) | ORAL | Status: DC

## 2013-06-22 MED ORDER — NAPROXEN 500 MG PO TABS: 500.0000 mg | ORAL_TABLET | Freq: Two times a day (BID) | ORAL | Status: DC

## 2013-06-22 MED ORDER — SULFAMETHOXAZOLE-TMP DS 800-160 MG PO TABS
1.0000 | ORAL_TABLET | Freq: Once | ORAL | Status: AC
  Administered 2013-06-22: 1 via ORAL
  Filled 2013-06-22: qty 1

## 2013-06-22 NOTE — ED Notes (Signed)
Patient reports rash and soreness to lower back x 4 days.

## 2013-06-22 NOTE — ED Provider Notes (Signed)
CSN: [id number]     Arrival date & time 06/22/13  0129 History   First MD Initiated Contact with Patient 06/22/13 0144     Chief Complaint  Patient presents with  . Rash   (Consider location/radiation/quality/duration/timing/severity/associated sxs/prior Treatment) HPI Comments: 48 year old female, no history of diabetes presents with a complaint of a rash at the top of her gluteal cleft. This has been present for approximately 4 days, with gradual onset, persistent, gradually worsening and not associated with any significant amount of drainage or fevers. She has been using topical medication Band-Aids and triple antibiotic ointment without any significant relief  Patient is a 48 y.o. female presenting with rash. The history is provided by the patient.  Rash Associated symptoms: no fever, no nausea and not vomiting     Past Medical History  Diagnosis Date  . Diverticulitis   . Pneumonia   . Back pain    Past Surgical History  Procedure Laterality Date  . Cholecystectomy     History reviewed. No pertinent family history. History  Substance Use Topics  . Smoking status: Current Every Day Smoker  . Smokeless tobacco: Not on file  . Alcohol Use: No   OB History   Grav Para Term Preterm Abortions TAB SAB Ect Mult Living                 Review of Systems  Constitutional: Negative for fever and chills.  Gastrointestinal: Negative for nausea and vomiting.  Skin: Positive for rash.       abscess    Allergies  Review of patient's allergies indicates no known allergies.  Home Medications   Current Outpatient Rx  Name  Route  Sig  Dispense  Refill  . carisoprodol (SOMA) 350 MG tablet   Oral   Take 350 mg by mouth 4 (four) times daily as needed.           Marland Kitchen HYDROcodone-acetaminophen (NORCO) 5-325 MG per tablet   Oral   Take 1 tablet by mouth every 6 (six) hours as needed.           . naproxen (NAPROSYN) 500 MG tablet   Oral   Take 1 tablet (500 mg total) by mouth 2  (two) times daily with a meal.   30 tablet   0   . sulfamethoxazole-trimethoprim (SEPTRA DS) 800-160 MG per tablet   Oral   Take 1 tablet by mouth every 12 (twelve) hours.   20 tablet   0    BP 189/89  Pulse 118  Temp(Src) 98.1 F (36.7 C) (Oral)  Resp 20  Ht 5\' 7"  (1.702 m)  Wt 300 lb (136.079 kg)  BMI 46.98 kg/m2  SpO2 96%  LMP 05/23/2013 Physical Exam  Nursing note and vitals reviewed. Constitutional: She appears well-developed and well-nourished. No distress.  HENT:  Head: Normocephalic and atraumatic.  Eyes: Conjunctivae are normal. Right eye exhibits no discharge. Left eye exhibits no discharge. No scleral icterus.  Cardiovascular: Normal rate and regular rhythm.   No murmur heard. Pulmonary/Chest: Effort normal and breath sounds normal.  Musculoskeletal: She exhibits tenderness ( Mild tenderness to palpation over the top of the gluteal cleft on the left). She exhibits no edema.  Skin: Skin is warm and dry. She is not diaphoretic.  Area of erythema approximately 1.5 cm in diameter, mild tenderness, no induration, no fluctuance, this is located on the left superior gluteal cleft    ED Course  Procedures (including critical care time) Labs Review Labs Reviewed -  No data to display Imaging Review No results found.  EKG Interpretation   None       MDM   1. Cellulitis of gluteal region    Early cellulitis, no signs of pilonidal abscess, no signs of abscess at all, no fever, tachycardia and not concerning as this does not appear to be a systemic infection, patient does appear anxious which may be reflected in her tachycardia.  Bactrim given prior to discharge   Meds given in ED:  Medications  sulfamethoxazole-trimethoprim (BACTRIM DS) 800-160 MG per tablet 1 tablet (not administered)    New Prescriptions   NAPROXEN (NAPROSYN) 500 MG TABLET    Take 1 tablet (500 mg total) by mouth 2 (two) times daily with a meal.   SULFAMETHOXAZOLE-TRIMETHOPRIM (SEPTRA  DS) 800-160 MG PER TABLET    Take 1 tablet by mouth every 12 (twelve) hours.        Vida RollerBrian D Latisha Lasch, MD 06/22/13 [phone]

## 2015-07-03 ENCOUNTER — Telehealth: Payer: Self-pay | Admitting: Orthopaedic Surgery

## 2015-07-03 NOTE — Telephone Encounter (Signed)
Patient wants a refill of Norco 7.5-325 mgs. Qty 120.  This was lasted filled on 06-06-15

## 2015-07-04 MED ORDER — HYDROCODONE-ACETAMINOPHEN 7.5-325 MG PO TABS: 1.0000 | ORAL_TABLET | ORAL | Status: DC | PRN

## 2015-07-04 NOTE — Telephone Encounter (Signed)
Rx printed

## 2015-07-31 ENCOUNTER — Telehealth: Payer: Self-pay | Admitting: Orthopaedic Surgery

## 2015-07-31 MED ORDER — HYDROCODONE-ACETAMINOPHEN 7.5-325 MG PO TABS: 1.0000 | ORAL_TABLET | ORAL | Status: DC | PRN

## 2015-07-31 NOTE — Telephone Encounter (Signed)
Rx done. 

## 2015-08-01 ENCOUNTER — Telehealth: Payer: Self-pay | Admitting: Orthopaedic Surgery

## 2015-08-01 NOTE — Telephone Encounter (Signed)
Patient is requesting refill of Soma  350mg   Qty 90 Tablets

## 2015-08-02 MED ORDER — CARISOPRODOL 350 MG PO TABS: 350.0000 mg | ORAL_TABLET | Freq: Three times a day (TID) | ORAL | Status: DC

## 2015-08-02 NOTE — Telephone Encounter (Signed)
Rx printed

## 2015-08-28 ENCOUNTER — Encounter: Payer: Self-pay | Admitting: Orthopaedic Surgery

## 2015-08-28 ENCOUNTER — Ambulatory Visit (INDEPENDENT_AMBULATORY_CARE_PROVIDER_SITE_OTHER): Payer: Managed Care, Other (non HMO) | Admitting: Orthopaedic Surgery

## 2015-08-28 VITALS — BP 162/96 | HR 105 | Temp 98.1°F | Resp 18 | Ht 67.0 in | Wt 294.0 lb

## 2015-08-28 DIAGNOSIS — M545 Low back pain, unspecified: Secondary | ICD-10-CM

## 2015-08-28 MED ORDER — HYDROCODONE-ACETAMINOPHEN 7.5-325 MG PO TABS: 1.0000 | ORAL_TABLET | ORAL | Status: DC | PRN

## 2015-08-28 NOTE — Progress Notes (Signed)
Patient UJ:WJXBJYN:Wendy Ramirez, female DOB:February 23, 1966, 50 y.o. WGN:[id number]  Chief Complaint  Patient presents with  . Follow-up    follow up neck and back     HPI  Karin GoldenClaudia C Teresi is a 50 y.o. female who has lower back pain that is chronic.  She has no paresthesias.  She has no trauma.  She has no swelling.  She is doing her exercises.  She is taking her medicine.  HPI  Body mass index is 46.04 kg/(m^2).  Review of Systems  HENT: Negative for congestion.   Respiratory: Positive for shortness of breath. Negative for cough.   Musculoskeletal: Positive for myalgias, back pain and arthralgias.    Past Medical History  Diagnosis Date  . Diverticulitis   . Pneumonia   . Back pain     Past Surgical History  Procedure Laterality Date  . Cholecystectomy      No family history on file.  Social History Social History  Substance Use Topics  . Smoking status: Current Every Day Smoker  . Smokeless tobacco: None  . Alcohol Use: No    No Known Allergies  Current Outpatient Prescriptions  Medication Sig Dispense Refill  . carisoprodol (SOMA) 350 MG tablet Take 1 tablet (350 mg total) by mouth 3 (three) times daily. 90 tablet 2  . HYDROcodone-acetaminophen (NORCO) 7.5-325 MG tablet Take 1 tablet by mouth every 4 (four) hours as needed for moderate pain (Must last 30 days.  Do not drive or operate machinery while taking this medicine.). 120 tablet 0   No current facility-administered medications for this visit.     Physical Exam  Blood pressure 162/96, pulse 105, temperature 98.1 F (36.7 C), resp. rate 18, height 5\' 7"  (1.702 m), weight 294 lb (133.358 kg).  Constitutional: overall normal hygiene, normal nutrition, well developed, normal grooming, normal body habitus. Assistive device:none  Musculoskeletal: gait and station Limp none, muscle tone and strength are normal, no tremors or atrophy is present.  .  Neurological: coordination overall normal.  Deep tendon  reflex/nerve stretch intact.  Sensation normal.  Cranial nerves II-XII intact.   Skin:   normal overall no scars, lesions, ulcers or rashes. No psoriasis.  Psychiatric: Alert and oriented x 3.  Recent memory intact, remote memory unclear.  Normal mood and affect. Well groomed.  Good eye contact.  Cardiovascular: overall no swelling, no varicosities, no edema bilaterally, normal temperatures of the legs and arms, no clubbing, cyanosis and good capillary refill.  Lymphatic: palpation is normal. Spine/Pelvis examination:  Inspection:  Overall, sacoiliac joint benign and hips normal; without crepitus or defects.   Thoracic spine inspection: Alignment normal without kyphosis present   Lumbar spine inspection:  Alignment   normal lumbar lordosis, without scoliosis apparent.   Thoracic spine palpation:  without tenderness of spinal processes   Lumbar spine palpation: with tenderness of lumbar area; without tightness of lumbar muscles    Range of Motion:   Lumbar flexion, forward flexion is 35  without pain or tenderness    Lumbar extension is 10  without pain or tenderness   Left lateral bend is Normal  without pain or tenderness   Right lateral bend is Normal without pain or tenderness   Straight leg raising is Normal   Strength & tone: Normal   Stability overall normal stability    The patient has been educated about the nature of the problem(s) and counseled on treatment options.  The patient appeared to understand what I have discussed and is in  agreement with it.  Encounter Diagnosis  Name Primary?  . Midline low back pain without sciatica Yes    PLAN Call if any problems.  Precautions discussed.  Continue current medications.   Return to clinic 3 months

## 2015-09-26 ENCOUNTER — Telehealth: Payer: Self-pay | Admitting: Orthopaedic Surgery

## 2015-09-26 MED ORDER — HYDROCODONE-ACETAMINOPHEN 7.5-325 MG PO TABS: 1.0000 | ORAL_TABLET | ORAL | Status: DC | PRN

## 2015-09-26 NOTE — Telephone Encounter (Signed)
Hydrocodone-Acetaminophen 7.5/325mg Qty 120 Tablets °

## 2015-09-26 NOTE — Telephone Encounter (Signed)
Rx done. 

## 2015-10-24 ENCOUNTER — Telehealth: Payer: Self-pay | Admitting: Orthopaedic Surgery

## 2015-10-24 MED ORDER — CARISOPRODOL 350 MG PO TABS: 350.0000 mg | ORAL_TABLET | Freq: Three times a day (TID) | ORAL | Status: DC

## 2015-10-24 MED ORDER — HYDROCODONE-ACETAMINOPHEN 7.5-325 MG PO TABS: 1.0000 | ORAL_TABLET | ORAL | Status: DC | PRN

## 2015-10-24 NOTE — Telephone Encounter (Signed)
Soma 350 mg  Qty 90 Tablets

## 2015-10-24 NOTE — Telephone Encounter (Signed)
Rx done. 

## 2015-10-24 NOTE — Telephone Encounter (Signed)
Hydrocodone-Acetaminophen 7.5/325mg Qty 120 Tablets °

## 2015-11-28 ENCOUNTER — Ambulatory Visit (INDEPENDENT_AMBULATORY_CARE_PROVIDER_SITE_OTHER): Payer: Self-pay | Admitting: Orthopaedic Surgery

## 2015-11-28 ENCOUNTER — Encounter: Payer: Self-pay | Admitting: Orthopaedic Surgery

## 2015-11-28 VITALS — BP 160/100 | HR 107 | Temp 98.6°F | Ht 68.0 in | Wt 296.0 lb

## 2015-11-28 DIAGNOSIS — M545 Low back pain, unspecified: Secondary | ICD-10-CM

## 2015-11-28 MED ORDER — HYDROCODONE-ACETAMINOPHEN 7.5-325 MG PO TABS: 1.0000 | ORAL_TABLET | ORAL | Status: DC | PRN

## 2015-11-28 NOTE — Progress Notes (Signed)
Patient Wendy Ramirez:Wendy Ramirez, female DOB:Sep 22, 1965, 50 y.o. [id number]  Chief Complaint  Patient presents with  . Follow-up    HPI  Wendy Ramirez is a 50 y.o. female who has chronic lower back pain. She is doing fairly well.  She is stable with her pain.  She continues her exercises. She has no new trauma. She has no paresthesias.  HPI  Body mass index is 45.02 kg/(m^2).  ROS  Review of Systems  HENT: Negative for congestion.   Respiratory: Positive for shortness of breath. Negative for cough.   Musculoskeletal: Positive for myalgias, back pain and arthralgias.    Past Medical History  Diagnosis Date  . Diverticulitis   . Pneumonia   . Back pain     Past Surgical History  Procedure Laterality Date  . Cholecystectomy      History reviewed. No pertinent family history.  Social History Social History  Substance Use Topics  . Smoking status: Current Every Day Smoker  . Smokeless tobacco: None  . Alcohol Use: No    No Known Allergies  Current Outpatient Prescriptions  Medication Sig Dispense Refill  . carisoprodol (SOMA) 350 MG tablet Take 1 tablet (350 mg total) by mouth 3 (three) times daily. 90 tablet 2  . HYDROcodone-acetaminophen (NORCO) 7.5-325 MG tablet Take 1 tablet by mouth every 4 (four) hours as needed for moderate pain (Must last 30 days.  Do not drive or operate machinery while taking this medicine.). 120 tablet 0   No current facility-administered medications for this visit.     Physical Exam  Blood pressure 160/100, pulse 107, temperature 98.6 F (37 C), height 5\' 8"  (1.727 m), weight 296 lb (134.265 kg).  Constitutional: overall normal hygiene, normal nutrition, well developed, normal grooming, normal body habitus. Assistive device:none  Musculoskeletal: gait and station Limp none, muscle tone and strength are normal, no tremors or atrophy is present.  .  Neurological: coordination overall normal.  Deep tendon reflex/nerve stretch intact.   Sensation normal.  Cranial nerves II-XII intact.   Skin:   normal overall no scars, lesions, ulcers or rashes. No psoriasis.  Psychiatric: Alert and oriented x 3.  Recent memory intact, remote memory unclear.  Normal mood and affect. Well groomed.  Good eye contact.  Cardiovascular: overall no swelling, no varicosities, no edema bilaterally, normal temperatures of the legs and arms, no clubbing, cyanosis and good capillary refill.  Lymphatic: palpation is normal.  Spine/Pelvis examination:  Inspection:  Overall, sacoiliac joint benign and hips nontender; without crepitus or defects.   Thoracic spine inspection: Alignment normal without kyphosis present   Lumbar spine inspection:  Alignment  with normal lumbar lordosis, without scoliosis apparent.   Thoracic spine palpation:  with tenderness of spinal processes   Lumbar spine palpation: with tenderness of lumbar area; without tightness of lumbar muscles    Range of Motion:   Lumbar flexion, forward flexion is 40  with pain or tenderness    Lumbar extension is 10  without pain or tenderness   Left lateral bend is Normal  without pain or tenderness   Right lateral bend is Normal without pain or tenderness   Straight leg raising is Normal   Strength & tone: Normal   Stability overall normal stability     The patient has been educated about the nature of the problem(s) and counseled on treatment options.  The patient appeared to understand what I have discussed and is in agreement with it.  Encounter Diagnoses  Name Primary?  .Marland Kitchen  Midline low back pain without sciatica Yes  . Morbid obesity due to excess calories [facility])     PLAN Call if any problems.  Precautions discussed.  Continue current medications.   Return to clinic 3 months   Electronically Signed Darreld McleanWayne Nija Koopman, MD 7/5/20172:48 PM

## 2015-12-26 ENCOUNTER — Telehealth: Payer: Self-pay | Admitting: Orthopaedic Surgery

## 2015-12-26 NOTE — Telephone Encounter (Signed)
Patient called and requests a refill on Hydrocodone/Acetaminophen(Norco) 7.5-325 mgs.   Qty  120  Sig: Take 1 tablet by mouth every 4 (four) hours as needed for moderate pain (Must last 30 days. Do not drive or operate machinery while taking this medicine.).   PATIENT ALSO REQUESTS:  A refill on Soma 350 mgs.  Qty 90  Sig: Take 1 tablet (350 mg total) by mouth 3 (three) times daily.

## 2015-12-27 MED ORDER — CARISOPRODOL 350 MG PO TABS
350.0000 mg | ORAL_TABLET | Freq: Three times a day (TID) | ORAL | 2 refills | Status: DC
Start: 2015-12-27 — End: 2016-04-16

## 2015-12-27 MED ORDER — HYDROCODONE-ACETAMINOPHEN 7.5-325 MG PO TABS: 1.0000 | ORAL_TABLET | ORAL | 0 refills | Status: DC | PRN

## 2016-01-23 ENCOUNTER — Telehealth: Payer: Self-pay | Admitting: Orthopaedic Surgery

## 2016-01-23 NOTE — Telephone Encounter (Signed)
Patient called and requested refill on Hydrocodone-Acetaminophen(Norco) 7.5-325 mgs. Qty 120   °       °Sig: Take 1 tablet by mouth every 4 (four) hours as needed for moderate pain (Must last 30 days.  Do not drive or operate machinery while taking this medicine.).   °  °  ° ° °

## 2016-01-24 MED ORDER — HYDROCODONE-ACETAMINOPHEN 7.5-325 MG PO TABS: 1.0000 | ORAL_TABLET | Freq: Four times a day (QID) | ORAL | 0 refills | Status: DC | PRN

## 2016-02-19 ENCOUNTER — Ambulatory Visit (INDEPENDENT_AMBULATORY_CARE_PROVIDER_SITE_OTHER): Payer: Self-pay | Admitting: Orthopaedic Surgery

## 2016-02-19 VITALS — BP 128/86 | HR 109 | Temp 98.6°F | Ht 67.0 in | Wt 300.0 lb

## 2016-02-19 DIAGNOSIS — M545 Low back pain, unspecified: Secondary | ICD-10-CM

## 2016-02-19 MED ORDER — HYDROCODONE-ACETAMINOPHEN 7.5-325 MG PO TABS: 1.0000 | ORAL_TABLET | Freq: Four times a day (QID) | ORAL | 0 refills | Status: DC | PRN

## 2016-02-19 NOTE — Progress Notes (Signed)
[name], female DOB:Oct 10, 1965, 50 y.o. WUJ:[id number]  Chief Complaint  Patient presents with  . Follow-up    back pain    HPI  Wendy Ramirez is a 50 y.o. female who has chronic lower back pain.  She is stable.  She has no paresthesias.  She has no new trauma. HPI  Body mass index is 46.99 kg/m.  ROS  Review of Systems  HENT: Negative for congestion.   Respiratory: Positive for shortness of breath. Negative for cough.   Musculoskeletal: Positive for arthralgias, back pain and myalgias.    Past Medical History:  Diagnosis Date  . Back pain   . Diverticulitis   . Pneumonia     Past Surgical History:  Procedure Laterality Date  . CHOLECYSTECTOMY      No family history on file.  Social History Social History  Substance Use Topics  . Smoking status: Current Every Day Smoker  . Smokeless tobacco: Not on file  . Alcohol use No    No Known Allergies  Current Outpatient Prescriptions  Medication Sig Dispense Refill  . carisoprodol (SOMA) 350 MG tablet Take 1 tablet (350 mg total) by mouth 3 (three) times daily. 90 tablet 2  . HYDROcodone-acetaminophen (NORCO) 7.5-325 MG tablet Take 1 tablet by mouth every 6 (six) hours as needed for moderate pain (Must last 30 days.Do not drive or operate machinery while taking this medicine.). 100 tablet 0   No current facility-administered medications for this visit.      Physical Exam  Blood pressure 128/86, pulse (!) 109, temperature 98.6 F (37 C), height 5\' 7"  (1.702 m), weight 300 lb (136.1 kg).  Constitutional: overall normal hygiene, normal nutrition, well developed, normal grooming, normal body habitus. Assistive device:none  Musculoskeletal: gait and station Limp none, muscle tone and strength are normal, no tremors or atrophy is present.  .  Neurological: coordination overall normal.  Deep tendon reflex/nerve stretch intact.  Sensation normal.  Cranial nerves II-XII intact.   Skin:   Normal  overall no scars, lesions, ulcers or rashes. No psoriasis.  Psychiatric: Alert and oriented x 3.  Recent memory intact, remote memory unclear.  Normal mood and affect. Well groomed.  Good eye contact.  Cardiovascular: overall no swelling, no varicosities, no edema bilaterally, normal temperatures of the legs and arms, no clubbing, cyanosis and good capillary refill.  Lymphatic: palpation is normal.  She has diffuse lower back pain with normal NV status.  Gait is normal. ROM is full. SLR is negative.  The patient has been educated about the nature of the problem(s) and counseled on treatment options.  The patient appeared to understand what I have discussed and is in agreement with it.  Encounter Diagnoses  Name Primary?  . Midline low back pain without sciatica Yes  . Morbid obesity due to excess calories [facility](HCC)     PLAN Call if any problems.  Precautions discussed.  Continue current medications.   Return to clinic 3 months   Electronically Signed Darreld McleanWayne Eris Breck, MD 9/26/20172:37 PM

## 2016-02-28 ENCOUNTER — Ambulatory Visit: Payer: Self-pay | Admitting: Orthopaedic Surgery

## 2016-03-19 ENCOUNTER — Telehealth: Payer: Self-pay | Admitting: Orthopaedic Surgery

## 2016-03-20 MED ORDER — HYDROCODONE-ACETAMINOPHEN 7.5-325 MG PO TABS: 1.0000 | ORAL_TABLET | Freq: Four times a day (QID) | ORAL | 0 refills | Status: DC | PRN

## 2016-04-08 ENCOUNTER — Encounter: Payer: Self-pay | Admitting: Orthopaedic Surgery

## 2016-04-15 ENCOUNTER — Other Ambulatory Visit: Payer: Self-pay | Admitting: *Deleted

## 2016-04-15 ENCOUNTER — Telehealth: Payer: Self-pay | Admitting: Orthopaedic Surgery

## 2016-04-15 MED ORDER — HYDROCODONE-ACETAMINOPHEN 7.5-325 MG PO TABS: 1.0000 | ORAL_TABLET | Freq: Four times a day (QID) | ORAL | 0 refills | Status: DC | PRN

## 2016-04-15 NOTE — Telephone Encounter (Signed)
Patient called back to add the following medication to her request:  carisoprodol (SOMA) 350 MG tablet 90 tablet 2 12/27/2015    Sig - Route: Take 1 tablet (350 mg total) by mouth 3 (three) times daily. - Oral

## 2016-04-16 MED ORDER — CARISOPRODOL 350 MG PO TABS: 350.0000 mg | ORAL_TABLET | Freq: Three times a day (TID) | ORAL | 2 refills | Status: DC

## 2016-05-10 ENCOUNTER — Encounter (HOSPITAL_COMMUNITY): Payer: Self-pay | Admitting: Emergency Medicine

## 2016-05-10 ENCOUNTER — Emergency Department (HOSPITAL_COMMUNITY)
Admission: EM | Admit: 2016-05-10 | Discharge: 2016-05-10 | Disposition: A | Discharge status: Home / Self Care | Payer: Self-pay | Attending: Emergency Medicine | Admitting: Emergency Medicine

## 2016-05-10 DIAGNOSIS — F17210 Nicotine dependence, cigarettes, uncomplicated: Secondary | ICD-10-CM | POA: Insufficient documentation

## 2016-05-10 DIAGNOSIS — Z79899 Other long term (current) drug therapy: Secondary | ICD-10-CM | POA: Insufficient documentation

## 2016-05-10 DIAGNOSIS — M549 Dorsalgia, unspecified: Secondary | ICD-10-CM | POA: Insufficient documentation

## 2016-05-10 LAB — URINALYSIS, ROUTINE W REFLEX MICROSCOPIC
Bacteria, UA: NONE SEEN
Bilirubin Urine: NEGATIVE
GLUCOSE, UA: NEGATIVE mg/dL
Ketones, ur: NEGATIVE mg/dL
Leukocytes, UA: NEGATIVE
Nitrite: NEGATIVE
Protein, ur: NEGATIVE mg/dL
SPECIFIC GRAVITY, URINE: 1.021 (ref 1.005–1.030)
pH: 5 (ref 5.0–8.0)

## 2016-05-10 NOTE — Discharge Instructions (Signed)
Your vital signs within normal limits. Your urinalysis does not seem to show evidence of kidney stone or major infection. I suspect your lower back pain is musculoskeletal in nature. Please use a heating pad. Please continue your current medications. Please see your doctor on Tuesday as scheduled. Please return to the emergency department immediately if any emergent changes, problems, or concerns.

## 2016-05-10 NOTE — ED Provider Notes (Addendum)
[facility]-EMERGENCY DEPT Provider Note   CSN: [id number] Arrival date & time: 05/10/16  1605     History   Chief Complaint Chief Complaint  Patient presents with  . Urinary Tract Infection    HPI Wendy Ramirez is a 50 y.o. female.  Patient is a 50 year old female who presents to the emergency department with a complaint of urinary symptoms.  The patient states that she's been having problems for over a week with lower back area pain. She thinks she may be going to the bathroom a little more frequently than usual. She has not had fever, chills, nausea, vomiting, or pain in the bladder area. It is of note that she has ongoing back problems, and is seen by specialist concerning this. She has been trying over-the-counter medication, but states this does not seem to be helping. The pain seems to be worse with certain movement and walking. The pain gets better when she takes her muscle relaxer and her back pain medication. She presents now for assistance with this issue.    Urinary Tract Infection   Pertinent negatives include no frequency and no hematuria.    Past Medical History:  Diagnosis Date  . Back pain   . Diverticulitis   . Pneumonia     There are no active problems to display for this patient.   Past Surgical History:  Procedure Laterality Date  . CHOLECYSTECTOMY    . TUBAL LIGATION      OB History    Gravida Para Term Preterm AB Living   4 4 4          SAB TAB Ectopic Multiple Live Births                   Home Medications    Prior to Admission medications   Medication Sig Start Date End Date Taking? Authorizing Provider  carisoprodol (SOMA) 350 MG tablet Take 1 tablet (350 mg total) by mouth 3 (three) times daily. 04/16/16  Yes Darreld McleanWayne Keeling, MD  HYDROcodone-acetaminophen (NORCO) 7.5-325 MG tablet Take 1 tablet by mouth every 6 (six) hours as needed for moderate pain (Must last 30 days.Do not drive or operate machinery while taking this medicine.).  04/15/16  Yes Darreld McleanWayne Keeling, MD    Family History Family History  Problem Relation Age of Onset  . Heart disease Father     Social History Social History  Substance Use Topics  . Smoking status: Current Every Day Smoker    Packs/day: 1.00    Types: Cigarettes  . Smokeless tobacco: Never Used  . Alcohol use No     Allergies   Patient has no known allergies.   Review of Systems Review of Systems  Constitutional: Negative for activity change.       All ROS Neg except as noted in HPI  HENT: Negative for nosebleeds.   Eyes: Negative for photophobia and discharge.  Respiratory: Negative for cough, shortness of breath and wheezing.   Cardiovascular: Negative for chest pain and palpitations.  Gastrointestinal: Negative for abdominal pain and blood in stool.  Genitourinary: Negative for difficulty urinating, dysuria, frequency, hematuria, vaginal bleeding and vaginal discharge.  Musculoskeletal: Positive for back pain. Negative for arthralgias and neck pain.  Skin: Negative.   Neurological: Negative for dizziness, seizures and speech difficulty.  Psychiatric/Behavioral: Negative for confusion and hallucinations.     Physical Exam Updated Vital Signs BP 147/56 (BP Location: Left Arm)   Pulse 99   Temp 98.1 F (36.7 C) (Oral)  Resp 18   Ht 5\' 7"  (1.702 m)   Wt 127 kg   SpO2 95%   BMI 43.85 kg/m   Physical Exam  Constitutional: She is oriented to person, place, and time. She appears well-developed and well-nourished.  Non-toxic appearance.  HENT:  Head: Normocephalic.  Right Ear: Tympanic membrane and external ear normal.  Left Ear: Tympanic membrane and external ear normal.  Eyes: EOM and lids are normal. Pupils are equal, round, and reactive to light.  Neck: Normal range of motion. Neck supple. Carotid bruit is not present.  Cardiovascular: Normal rate, regular rhythm, normal heart sounds, intact distal pulses and normal pulses.   Pulmonary/Chest: Breath sounds  normal. No respiratory distress.  Abdominal: Soft. Bowel sounds are normal. There is no tenderness. There is no guarding.  Mild suprapubic area tenderness.  Musculoskeletal: Normal range of motion.       Lumbar back: She exhibits pain.  Lymphadenopathy:       Head (right side): No submandibular adenopathy present.       Head (left side): No submandibular adenopathy present.    She has no cervical adenopathy.  Neurological: She is alert and oriented to person, place, and time. She has normal strength. No cranial nerve deficit or sensory deficit.  Skin: Skin is warm and dry.  Psychiatric: She has a normal mood and affect. Her speech is normal.  Nursing note and vitals reviewed.    ED Treatments / Results  Labs (all labs ordered are listed, but only abnormal results are displayed) Labs Reviewed  URINALYSIS, ROUTINE W REFLEX MICROSCOPIC - Abnormal; Notable for the following:       Result Value   Hgb urine dipstick SMALL (*)    All other components within normal limits    EKG  EKG Interpretation None       Radiology No results found.  Procedures Procedures (including critical care time)  Medications Ordered in ED Medications - No data to display   Initial Impression / Assessment and Plan / ED Course  I have reviewed the triage vital signs and the nursing notes.  Pertinent labs & imaging results that were available during my care of the patient were reviewed by me and considered in my medical decision making (see chart for details).  Clinical Course     **I have reviewed nursing notes, vital signs, and all appropriate lab and imaging results for this patient.*  Final Clinical Impressions(s) / ED Diagnoses  Vital signs within normal limits. Pain with range of motion involving the right paraspinal area of the lumbar spine. There is no pain to palpation over the bladder area.  I suspect the patient has a musculoskeletal back issue instead of a bladder issue as the  urinalysis does not seem to indicate a urinary tract infection. The patient is currently on hydrocodone and Soma. The patient will see her specialist on Tuesday, December 19.    Final diagnoses:  Musculoskeletal back pain    New Prescriptions New Prescriptions   No medications on file     Ivery QualeHobson Rosealie Reach, PA-C 05/10/16 1859    Samuel JesterKathleen McManus, DO 05/14/16 1605    Ivery QualeHobson Shela Esses, PA-C 05/28/16 1733    Samuel JesterKathleen McManus, DO 06/05/16 1331

## 2016-05-10 NOTE — ED Triage Notes (Signed)
Pt reports urinary symptoms x 1 week. Pt states she has been taking OTC meds but symptoms are no better.

## 2016-05-14 ENCOUNTER — Ambulatory Visit (INDEPENDENT_AMBULATORY_CARE_PROVIDER_SITE_OTHER): Payer: Self-pay | Admitting: Orthopaedic Surgery

## 2016-05-14 ENCOUNTER — Encounter: Payer: Self-pay | Admitting: Orthopaedic Surgery

## 2016-05-14 VITALS — BP 175/106 | HR 107 | Temp 97.9°F | Ht 67.0 in | Wt 294.0 lb

## 2016-05-14 DIAGNOSIS — G8929 Other chronic pain: Secondary | ICD-10-CM

## 2016-05-14 DIAGNOSIS — M545 Low back pain: Secondary | ICD-10-CM

## 2016-05-14 MED ORDER — HYDROCODONE-ACETAMINOPHEN 7.5-325 MG PO TABS: 1.0000 | ORAL_TABLET | Freq: Four times a day (QID) | ORAL | 0 refills | Status: DC | PRN

## 2016-05-14 NOTE — Patient Instructions (Signed)
Steps to Quit Smoking Smoking tobacco can be bad for your health. It can also affect almost every organ in your body. Smoking puts you and people around you at risk for many serious long-lasting (chronic) diseases. Quitting smoking is hard, but it is one of the best things that you can do for your health. It is never too late to quit. What are the benefits of quitting smoking? When you quit smoking, you lower your risk for getting serious diseases and conditions. They can include:  Lung cancer or lung disease.  Heart disease.  Stroke.  Heart attack.  Not being able to have children (infertility).  Weak bones (osteoporosis) and broken bones (fractures). If you have coughing, wheezing, and shortness of breath, those symptoms may get better when you quit. You may also get sick less often. If you are pregnant, quitting smoking can help to lower your chances of having a baby of low birth weight. What can I do to help me quit smoking? Talk with your doctor about what can help you quit smoking. Some things you can do (strategies) include:  Quitting smoking totally, instead of slowly cutting back how much you smoke over a period of time.  Going to in-person counseling. You are more likely to quit if you go to many counseling sessions.  Using resources and support systems, such as:  Online chats with a counselor.  Phone quitlines.  Printed self-help materials.  Support groups or group counseling.  Text messaging programs.  Mobile phone apps or applications.  Taking medicines. Some of these medicines may have nicotine in them. If you are pregnant or breastfeeding, do not take any medicines to quit smoking unless your doctor says it is okay. Talk with your doctor about counseling or other things that can help you. Talk with your doctor about using more than one strategy at the same time, such as taking medicines while you are also going to in-person counseling. This can help make quitting  easier. What things can I do to make it easier to quit? Quitting smoking might feel very hard at first, but there is a lot that you can do to make it easier. Take these steps:  Talk to your family and friends. Ask them to support and encourage you.  Call phone quitlines, reach out to support groups, or work with a counselor.  Ask people who smoke to not smoke around you.  Avoid places that make you want (trigger) to smoke, such as:  Bars.  Parties.  Smoke-break areas at work.  Spend time with people who do not smoke.  Lower the stress in your life. Stress can make you want to smoke. Try these things to help your stress:  Getting regular exercise.  Deep-breathing exercises.  Yoga.  Meditating.  Doing a body scan. To do this, close your eyes, focus on one area of your body at a time from head to toe, and notice which parts of your body are tense. Try to relax the muscles in those areas.  Download or buy apps on your mobile phone or tablet that can help you stick to your quit plan. There are many free apps, such as QuitGuide from the CDC (Centers for Disease Control and Prevention). You can find more support from smokefree.gov and other websites. This information is not intended to replace advice given to you by your health care provider. Make sure you discuss any questions you have with your health care provider. Document Released: 03/08/2009 Document Revised: 01/08/2016 Document   Reviewed: 09/26/2014 Elsevier Interactive Patient Education  2017 [employer] Inc.  

## 2016-05-14 NOTE — Progress Notes (Signed)
[name], female DOB:[birth year], 50 y.o. WUJ:[id number]  Chief Complaint  Patient presents with  . Follow-up    back pain    HPI  Wendy Ramirez is a 50 y.o. female who has chronic lower back pain.  She went to the ER this past weekend thinking she had bladder infection and they told her it was musculoskeletal pain.  She has no paresthesias.  She has no trauma. HPI  Body mass index is 46.05 kg/m.  ROS  Review of Systems  HENT: Negative for congestion.   Respiratory: Positive for shortness of breath. Negative for cough.   Musculoskeletal: Positive for arthralgias, back pain and myalgias.    Past Medical History:  Diagnosis Date  . Back pain   . Diverticulitis   . Pneumonia     Past Surgical History:  Procedure Laterality Date  . CHOLECYSTECTOMY    . TUBAL LIGATION      Family History  Problem Relation Age of Onset  . Heart disease Father     Social History Social History  Substance Use Topics  . Smoking status: Current Every Day Smoker    Packs/day: 1.00    Types: Cigarettes  . Smokeless tobacco: Never Used  . Alcohol use No    No Known Allergies  Current Outpatient Prescriptions  Medication Sig Dispense Refill  . carisoprodol (SOMA) 350 MG tablet Take 1 tablet (350 mg total) by mouth 3 (three) times daily. 90 tablet 2  . HYDROcodone-acetaminophen (NORCO) 7.5-325 MG tablet Take 1 tablet by mouth every 6 (six) hours as needed for moderate pain (Must last 30 days.Do not drive or operate machinery while taking this medicine.). 80 tablet 0   No current facility-administered medications for this visit.      Physical Exam  Blood pressure (!) 175/106, pulse (!) 107, temperature 97.9 F (36.6 C), height 5\' 7"  (1.702 m), weight 294 lb (133.4 kg).  Constitutional: overall normal hygiene, normal nutrition, well developed, normal grooming, normal body habitus. Assistive device:none  Musculoskeletal: gait and station Limp none, muscle tone  and strength are normal, no tremors or atrophy is present.  .  Neurological: coordination overall normal.  Deep tendon reflex/nerve stretch intact.  Sensation normal.  Cranial nerves II-XII intact.   Skin:   Normal overall no scars, lesions, ulcers or rashes. No psoriasis.  Psychiatric: Alert and oriented x 3.  Recent memory intact, remote memory unclear.  Normal mood and affect. Well groomed.  Good eye contact.  Cardiovascular: overall no swelling, no varicosities, no edema bilaterally, normal temperatures of the legs and arms, no clubbing, cyanosis and good capillary refill.  Lymphatic: palpation is normal.  Spine/Pelvis examination:  Inspection:  Overall, sacoiliac joint benign and hips nontender; without crepitus or defects.   Thoracic spine inspection: Alignment normal without kyphosis present   Lumbar spine inspection:  Alignment  with normal lumbar lordosis, without scoliosis apparent.   Thoracic spine palpation:  without tenderness of spinal processes   Lumbar spine palpation: with tenderness of lumbar area; without tightness of lumbar muscles    Range of Motion:   Lumbar flexion, forward flexion is 45 without pain or tenderness    Lumbar extension is 15 without pain or tenderness   Left lateral bend is Normal  without pain or tenderness   Right lateral bend is Normal without pain or tenderness   Straight leg raising is Normal   Strength & tone: Normal   Stability overall normal stability     The patient has  been educated about the nature of the problem(s) and counseled on treatment options.  The patient appeared to understand what I have discussed and is in agreement with it.  Encounter Diagnoses  Name Primary?  . Chronic midline low back pain without sciatica Yes  . Morbid obesity due to excess calories [facility](HCC)     PLAN Call if any problems.  Precautions discussed.  Continue current medications.   Return to clinic 3 months   Electronically Signed Darreld McleanWayne  Christiane Sistare, MD 12/20/20171:59 PM

## 2016-05-21 ENCOUNTER — Ambulatory Visit: Payer: Self-pay | Admitting: Orthopaedic Surgery

## 2016-06-11 ENCOUNTER — Telehealth: Payer: Self-pay | Admitting: Orthopaedic Surgery

## 2016-06-13 MED ORDER — HYDROCODONE-ACETAMINOPHEN 7.5-325 MG PO TABS: 1.0000 | ORAL_TABLET | Freq: Four times a day (QID) | ORAL | 0 refills | Status: DC | PRN

## 2016-06-16 ENCOUNTER — Emergency Department (HOSPITAL_COMMUNITY): Payer: Self-pay

## 2016-06-16 ENCOUNTER — Emergency Department (HOSPITAL_COMMUNITY)
Admission: EM | Admit: 2016-06-16 | Discharge: 2016-06-16 | Disposition: A | Discharge status: Home / Self Care | Payer: Self-pay | Attending: Emergency Medicine | Admitting: Emergency Medicine

## 2016-06-16 ENCOUNTER — Other Ambulatory Visit: Payer: Self-pay

## 2016-06-16 ENCOUNTER — Encounter (HOSPITAL_COMMUNITY): Payer: Self-pay | Admitting: Emergency Medicine

## 2016-06-16 DIAGNOSIS — I480 Paroxysmal atrial fibrillation: Secondary | ICD-10-CM | POA: Insufficient documentation

## 2016-06-16 DIAGNOSIS — I4891 Unspecified atrial fibrillation: Secondary | ICD-10-CM

## 2016-06-16 DIAGNOSIS — R0602 Shortness of breath: Secondary | ICD-10-CM | POA: Insufficient documentation

## 2016-06-16 DIAGNOSIS — Z79899 Other long term (current) drug therapy: Secondary | ICD-10-CM | POA: Insufficient documentation

## 2016-06-16 DIAGNOSIS — F17210 Nicotine dependence, cigarettes, uncomplicated: Secondary | ICD-10-CM | POA: Insufficient documentation

## 2016-06-16 LAB — BASIC METABOLIC PANEL
Anion gap: 9 (ref 5–15)
BUN: 14 mg/dL (ref 6–20)
CHLORIDE: 104 mmol/L (ref 101–111)
CO2: 27 mmol/L (ref 22–32)
Calcium: 9.2 mg/dL (ref 8.9–10.3)
Creatinine, Ser: 0.54 mg/dL (ref 0.44–1.00)
GFR calc Af Amer: >60 mL/min (ref >60)
GFR calc non Af Amer: >60 mL/min (ref >60)
GLUCOSE: 136 mg/dL — AB (ref 65–99)
POTASSIUM: 4.2 mmol/L (ref 3.5–5.1)
Sodium: 140 mmol/L (ref 135–145)

## 2016-06-16 LAB — BRAIN NATRIURETIC PEPTIDE: B NATRIURETIC PEPTIDE 5: 27 pg/mL (ref 0.0–100.0)

## 2016-06-16 LAB — TROPONIN I

## 2016-06-16 LAB — CBC
HCT: 51.8 % — ABNORMAL HIGH (ref 36.0–46.0)
HEMOGLOBIN: 17.4 g/dL — AB (ref 12.0–15.0)
MCH: 31.9 pg (ref 26.0–34.0)
MCHC: 33.6 g/dL (ref 30.0–36.0)
MCV: 94.9 fL (ref 78.0–100.0)
Platelets: 251 10*3/uL (ref 150–400)
RBC: 5.46 MIL/uL — AB (ref 3.87–5.11)
RDW: 13.4 % (ref 11.5–15.5)
WBC: 13.1 10*3/uL — ABNORMAL HIGH (ref 4.0–10.5)

## 2016-06-16 LAB — URINALYSIS, ROUTINE W REFLEX MICROSCOPIC
BACTERIA UA: NONE SEEN
Bilirubin Urine: NEGATIVE
Glucose, UA: NEGATIVE mg/dL
KETONES UR: NEGATIVE mg/dL
LEUKOCYTES UA: NEGATIVE
Nitrite: NEGATIVE
PROTEIN: 30 mg/dL — AB
Specific Gravity, Urine: 1.016 (ref 1.005–1.030)
pH: 5 (ref 5.0–8.0)

## 2016-06-16 LAB — MAGNESIUM: Magnesium: 2 mg/dL (ref 1.7–2.4)

## 2016-06-16 MED ORDER — METOPROLOL TARTRATE 5 MG/5ML IV SOLN
5.0000 mg | Freq: Once | INTRAVENOUS | Status: AC
  Administered 2016-06-16: 5 mg via INTRAVENOUS
  Filled 2016-06-16: qty 5

## 2016-06-16 MED ORDER — DILTIAZEM HCL 25 MG/5ML IV SOLN
20.0000 mg | Freq: Once | INTRAVENOUS | Status: AC
  Administered 2016-06-16: 20 mg via INTRAVENOUS

## 2016-06-16 MED ORDER — METOPROLOL SUCCINATE ER 50 MG PO TB24: 50.0000 mg | ORAL_TABLET | Freq: Every day | ORAL | 0 refills | Status: DC

## 2016-06-16 MED ORDER — DILTIAZEM HCL 25 MG/5ML IV SOLN: 15.0000 mg | Freq: Once | INTRAVENOUS | Status: DC

## 2016-06-16 MED ORDER — ASPIRIN 81 MG PO CHEW
81.0000 mg | CHEWABLE_TABLET | Freq: Once | ORAL | Status: AC
  Administered 2016-06-16: 81 mg via ORAL
  Filled 2016-06-16: qty 1

## 2016-06-16 MED ORDER — DILTIAZEM LOAD VIA INFUSION
10.0000 mg | Freq: Once | INTRAVENOUS | Status: AC
  Administered 2016-06-16: 10 mg via INTRAVENOUS
  Filled 2016-06-16: qty 10

## 2016-06-16 MED ORDER — METOPROLOL TARTRATE 25 MG PO TABS
25.0000 mg | ORAL_TABLET | Freq: Once | ORAL | Status: AC
  Administered 2016-06-16: 25 mg via ORAL
  Filled 2016-06-16: qty 1

## 2016-06-16 MED ORDER — DILTIAZEM HCL-DEXTROSE 100-5 MG/100ML-% IV SOLN (PREMIX)
5.0000 mg/h | INTRAVENOUS | Status: DC
  Administered 2016-06-16: 5 mg/h via INTRAVENOUS
  Filled 2016-06-16: qty 100

## 2016-06-16 NOTE — Discharge Instructions (Signed)
Avoid avoid caffinated products, such as teas, colas, coffee, chocolate. Avoid over the counter cold medicines, herbal or "natural vitamin" products, and illicit drugs because they can contain stimulants. Take the prescription as directed (start tomorrow). Also start to take one "baby aspirin" (aspirin 81mg ) by mouth daily. Go to the Cardiologist office this Thursday at 10:20am to be seen in follow up. Return to the Emergency Department immediately if worsening.

## 2016-06-16 NOTE — ED Triage Notes (Signed)
Pt c/o cp and heart racing x 2-3 hours. denies n/v/sob. reports sweating.

## 2016-06-16 NOTE — ED Provider Notes (Signed)
[facility] DEPT Provider Note   CSN: [id number] Arrival date & time: 06/16/16  1315     History   Chief Complaint Chief Complaint  Patient presents with  . Chest Pain  . Palpitations    HPI Wendy Ramirez is a 51 y.o. female.  HPI  Pt was seen at 1340. Per pt, c/o unknown onset and persistence of constant palpitations that she noticed when she woke up this morning. Has been associated with CP and SOB. Symptoms worsen with exertion, improve with rest. Denies new medications, no new OTC meds, no back pain, no abd pain, no N/V/D, no fevers.   Past Medical History:  Diagnosis Date  . Back pain   . Diverticulitis   . Pneumonia     There are no active problems to display for this patient.   Past Surgical History:  Procedure Laterality Date  . CHOLECYSTECTOMY    . TUBAL LIGATION      OB History    Gravida Para Term Preterm AB Living   4 4 4          SAB TAB Ectopic Multiple Live Births                   Home Medications    Prior to Admission medications   Medication Sig Start Date End Date Taking? Authorizing Provider  carisoprodol (SOMA) 350 MG tablet Take 1 tablet (350 mg total) by mouth 3 (three) times daily. 04/16/16   Darreld McleanWayne Keeling, MD  HYDROcodone-acetaminophen (NORCO) 7.5-325 MG tablet Take 1 tablet by mouth every 6 (six) hours as needed for moderate pain (Must last 30 days.Do not drive or operate machinery while taking this medicine.). 06/13/16   Darreld McleanWayne Keeling, MD    Family History Family History  Problem Relation Age of Onset  . Heart disease Father     Social History Social History  Substance Use Topics  . Smoking status: Current Every Day Smoker    Packs/day: 1.00    Types: Cigarettes  . Smokeless tobacco: Never Used  . Alcohol use No     Allergies   Patient has no known allergies.   Review of Systems Review of Systems ROS: Statement: All systems negative except as marked or noted in the HPI; Constitutional: Negative for fever and  chills. ; ; Eyes: Negative for eye pain, redness and discharge. ; ; ENMT: Negative for ear pain, hoarseness, nasal congestion, sinus pressure and sore throat. ; ; Cardiovascular: +CP, SOB, palpitations. Negative for diaphoresis, and peripheral edema. ; ; Respiratory: Negative for cough, wheezing and stridor. ; ; Gastrointestinal: Negative for nausea, vomiting, diarrhea, abdominal pain, blood in stool, hematemesis, jaundice and rectal bleeding. . ; ; Genitourinary: Negative for dysuria, flank pain and hematuria. ; ; Musculoskeletal: Negative for back pain and neck pain. Negative for swelling and trauma.; ; Skin: Negative for pruritus, rash, abrasions, blisters, bruising and skin lesion.; ; Neuro: Negative for headache, lightheadedness and neck stiffness. Negative for weakness, altered level of consciousness, altered mental status, extremity weakness, paresthesias, involuntary movement, seizure and syncope.       Physical Exam Updated Vital Signs BP 163/93 (BP Location: Left Arm)   Pulse (!) 161   Temp (!) 96.2 F (35.7 C) (Temporal)   Ht 5\' 7"  (1.702 m)   Wt 290 lb (131.5 kg)   SpO2 95%   BMI 45.42 kg/m    Patient Vitals for the past 24 hrs:  BP Temp Temp src Pulse Resp SpO2 Height Weight  06/16/16 1636  113/77 - - 93 - - - -  06/16/16 1600 103/82 - - (!) 50 13 92 % - -  06/16/16 1552 115/75 - - 101 18 95 % - -  06/16/16 1507 112/75 - - - 18 95 % - -  06/16/16 1506 112/75 - - - 18 96 % - -  06/16/16 1421 136/77 - - (!) 151 20 95 % - -  06/16/16 1355 141/84 - - (!) 160 20 95 % - -  06/16/16 1323 163/93 (!) 96.2 F (35.7 C) Temporal (!) 161 - 95 % - -  06/16/16 1322 - - - - - - 5\' 7"  (1.702 m) 290 lb (131.5 kg)      Physical Exam 1345: Physical examination:  Nursing notes reviewed; Vital signs and O2 SAT reviewed;  Constitutional: Well developed, Well nourished, Well hydrated, In no acute distress; Head:  Normocephalic, atraumatic; Eyes: EOMI, PERRL, No scleral icterus; ENMT: Mouth and  pharynx normal, Mucous membranes moist; Neck: Supple, Full range of motion, No lymphadenopathy; Cardiovascular: Irregular rhythm, and tachycardic rate, No gallop; Respiratory: Breath sounds clear & equal bilaterally, No wheezes.  Speaking full sentences with ease, Normal respiratory effort/excursion; Chest: Nontender, Movement normal; Abdomen: Soft, Nontender, Nondistended, Normal bowel sounds; Genitourinary: No CVA tenderness; Extremities: Pulses normal, No tenderness, No edema, No calf edema or asymmetry.; Neuro: AA&Ox3, Major CN grossly intact.  Speech clear. No gross focal motor or sensory deficits in extremities.; Skin: Color normal, Warm, Dry.   ED Treatments / Results  Labs (all labs ordered are listed, but only abnormal results are displayed)   EKG  EKG Interpretation  Date/Time:  Monday June 16 2016 13:17:43 EST Ventricular Rate:  164 PR Interval:    QRS Duration: 78 QT Interval:  276 QTC Calculation: 455 R Axis:   26 Text Interpretation:  Atrial fibrillation with rapid ventricular response Septal infarct , age undetermined Abnormal ECG No old tracing to compare Confirmed by [facility]  MD, Nicholos Johns [phone]) on 06/16/2016 1:41:39 PM       EKG Interpretation  Date/Time:  Monday June 16 2016 15:54:07 EST Ventricular Rate:  105 PR Interval:    QRS Duration: 95 QT Interval:  371 QTC Calculation: 491 R Axis:   12 Text Interpretation:  Atrial fibrillation Low voltage, extremity and precordial leads Rate slower Since last tracing of earlier today Confirmed by [facility]  MD, Nicholos Johns [phone]) on 06/16/2016 4:23:02 PM         Radiology   Procedures Procedures (including critical care time)  Medications Ordered in ED Medications  diltiazem (CARDIZEM) 1 mg/mL load via infusion 10 mg (10 mg Intravenous Bolus from Bag 06/16/16 1352)    And  diltiazem (CARDIZEM) 100 mg in dextrose 5% (1 mg/mL) infusion (5 mg/hr Intravenous New Bag/Given 06/16/16 1353)     Initial  Impression / Assessment and Plan / ED Course  I have reviewed the triage vital signs and the nursing notes.  Pertinent labs & imaging results that were available during my care of the patient were reviewed by me and considered in my medical decision making (see chart for details).  MDM Reviewed: previous chart, nursing note and vitals Reviewed previous: labs Interpretation: labs, ECG and x-ray   Results for orders placed or performed during the hospital encounter of 06/16/16  CBC  Result Value Ref Range   WBC 13.1 (H) 4.0 - 10.5 K/uL   RBC 5.46 (H) 3.87 - 5.11 MIL/uL   Hemoglobin 17.4 (H) 12.0 - 15.0 g/dL  HCT 51.8 (H) 36.0 - 46.0 %   MCV 94.9 78.0 - 100.0 fL   MCH 31.9 26.0 - 34.0 pg   MCHC 33.6 30.0 - 36.0 g/dL   RDW 76.2 83.1 - 51.7 %   Platelets 251 150 - 400 K/uL  Basic metabolic panel  Result Value Ref Range   Sodium 140 135 - 145 mmol/L   Potassium 4.2 3.5 - 5.1 mmol/L   Chloride 104 101 - 111 mmol/L   CO2 27 22 - 32 mmol/L   Glucose, Bld 136 (H) 65 - 99 mg/dL   BUN 14 6 - 20 mg/dL   Creatinine, Ser 6.16 0.44 - 1.00 mg/dL   Calcium 9.2 8.9 - 07.3 mg/dL   GFR calc non Af Amer >60 >60 mL/min   GFR calc Af Amer >60 >60 mL/min   Anion gap 9 5 - 15  Troponin I  Result Value Ref Range   Troponin I <0.03 <0.03 ng/mL  Magnesium  Result Value Ref Range   Magnesium 2.0 1.7 - 2.4 mg/dL   Dg Chest Port 1 View Result Date: 06/16/2016 CLINICAL DATA:  Chest tightness and shortness of breath. EXAM: PORTABLE CHEST 1 VIEW COMPARISON:  08/08/2010. FINDINGS: Trachea is midline. Heart size is accentuated by AP technique. Lungs are clear. No pleural fluid. IMPRESSION: No acute findings. Electronically Signed   By: Leanna Battles M.D.   On: 06/16/2016 14:09    1515:  IV cardizam bolus x2 given and gtt increased; HR improved from 160's to 110's, but continues to increase to 130's. Will also dose IVF bolus for hemconcentration. CHADSVASc score 1; pt is low risk and will hold  anticoagulation at this time. Pt would prefer to go home vs admission.  T/C to Cards Dr. Diona Browner, case discussed, including:  HPI, pertinent PM/SHx, VS/PE, dx testing, ED course and treatment:  Agrees with ED treatment, states can also try lopressor 5mg  IV x1 dose to attempt further rate control (goal), since pt is low risk, pt can be either admitted or discharged depending if can achieve rate control in the ED (if d/c: rx cardizem vs b-blocker PO, ASA - does not need anticoagulation).   1615:  IV lopressor 5mg  given with good effect; IV cardizem weaned off. Monitor continues afib, HR 80's. Pt feels better and wants to go home now. T/C to Cards Dr. Diona Browner, case discussed, including:  HPI, pertinent PM/SHx, VS/PE, dx testing, ED course and treatment:  Agrees with d/c home, start ASA 81mg  PO daily, toprol 25mg  PO now, then start tomorrow toprol 50mg  PO daily, f/u in [place] office Thursday 10:20am with Dr. Wyline Mood.   1655:  Tol PO well without N/V. Pt continues to feel "better." HR 61, controlled afib on monitor. Dx and testing, as well as d/w Cards MD, d/w pt and family.  Questions answered.  Verb understanding, agreeable to d/c home with outpt f/u.   Final Clinical Impressions(s) / ED Diagnoses   Final diagnoses:  None    New Prescriptions New Prescriptions   No medications on file     Samuel Jester, DO 06/18/16 1850

## 2016-06-18 ENCOUNTER — Emergency Department (HOSPITAL_COMMUNITY): Payer: Self-pay

## 2016-06-18 ENCOUNTER — Encounter (HOSPITAL_COMMUNITY): Payer: Self-pay

## 2016-06-18 ENCOUNTER — Emergency Department (HOSPITAL_COMMUNITY)
Admission: EM | Admit: 2016-06-18 | Discharge: 2016-06-18 | Disposition: A | Discharge status: Home / Self Care | Payer: Self-pay | Attending: Emergency Medicine | Admitting: Emergency Medicine

## 2016-06-18 DIAGNOSIS — F17210 Nicotine dependence, cigarettes, uncomplicated: Secondary | ICD-10-CM | POA: Insufficient documentation

## 2016-06-18 DIAGNOSIS — I480 Paroxysmal atrial fibrillation: Secondary | ICD-10-CM | POA: Insufficient documentation

## 2016-06-18 DIAGNOSIS — Z79899 Other long term (current) drug therapy: Secondary | ICD-10-CM | POA: Insufficient documentation

## 2016-06-18 DIAGNOSIS — I4891 Unspecified atrial fibrillation: Secondary | ICD-10-CM

## 2016-06-18 DIAGNOSIS — Z7982 Long term (current) use of aspirin: Secondary | ICD-10-CM | POA: Insufficient documentation

## 2016-06-18 HISTORY — DX: Unspecified atrial fibrillation: I48.91

## 2016-06-18 HISTORY — DX: Supraventricular tachycardia, unspecified: I47.10

## 2016-06-18 HISTORY — DX: Supraventricular tachycardia: I47.1

## 2016-06-18 LAB — BASIC METABOLIC PANEL
ANION GAP: 10 (ref 5–15)
BUN: 19 mg/dL (ref 6–20)
CALCIUM: 8.8 mg/dL — AB (ref 8.9–10.3)
CHLORIDE: 106 mmol/L (ref 101–111)
CO2: 25 mmol/L (ref 22–32)
Creatinine, Ser: 0.6 mg/dL (ref 0.44–1.00)
GFR calc non Af Amer: >60 mL/min (ref >60)
Glucose, Bld: 152 mg/dL — ABNORMAL HIGH (ref 65–99)
POTASSIUM: 3.7 mmol/L (ref 3.5–5.1)
Sodium: 141 mmol/L (ref 135–145)

## 2016-06-18 LAB — CBC
HEMATOCRIT: 48.7 % — AB (ref 36.0–46.0)
HEMOGLOBIN: 16.2 g/dL — AB (ref 12.0–15.0)
MCH: 31.8 pg (ref 26.0–34.0)
MCHC: 33.3 g/dL (ref 30.0–36.0)
MCV: 95.7 fL (ref 78.0–100.0)
Platelets: 238 10*3/uL (ref 150–400)
RBC: 5.09 MIL/uL (ref 3.87–5.11)
RDW: 13.5 % (ref 11.5–15.5)
WBC: 12.7 10*3/uL — AB (ref 4.0–10.5)

## 2016-06-18 LAB — TROPONIN I: Troponin I: <0.03 ng/mL (ref <0.03)

## 2016-06-18 LAB — TSH: TSH: 1.737 u[IU]/mL (ref 0.350–4.500)

## 2016-06-18 MED ORDER — ENOXAPARIN SODIUM 150 MG/ML ~~LOC~~ SOLN
1.0000 mg/kg | Freq: Once | SUBCUTANEOUS | Status: AC
  Administered 2016-06-18: 130 mg via SUBCUTANEOUS
  Filled 2016-06-18: qty 1

## 2016-06-18 MED ORDER — ADENOSINE 6 MG/2ML IV SOLN
INTRAVENOUS | Status: AC
  Filled 2016-06-18: qty 6

## 2016-06-18 MED ORDER — METOPROLOL SUCCINATE ER 50 MG PO TB24: 100.0000 mg | ORAL_TABLET | Freq: Every day | ORAL | 0 refills | Status: DC

## 2016-06-18 MED ORDER — DILTIAZEM HCL-DEXTROSE 100-5 MG/100ML-% IV SOLN (PREMIX)
5.0000 mg/h | INTRAVENOUS | Status: DC
  Administered 2016-06-18: 5 mg/h via INTRAVENOUS
  Filled 2016-06-18: qty 100

## 2016-06-18 MED ORDER — METOPROLOL SUCCINATE ER 50 MG PO TB24
50.0000 mg | ORAL_TABLET | Freq: Once | ORAL | Status: AC
  Administered 2016-06-18: 50 mg via ORAL
  Filled 2016-06-18: qty 1

## 2016-06-18 MED ORDER — DILTIAZEM LOAD VIA INFUSION
15.0000 mg | Freq: Once | INTRAVENOUS | Status: AC
  Administered 2016-06-18: 15 mg via INTRAVENOUS
  Filled 2016-06-18: qty 15

## 2016-06-18 NOTE — ED Triage Notes (Signed)
Patient reports of pressure in left side of chest that began this morning when she woke up.  Also reports of increased shortness of breath with ambulation and palpitations.  Was seen on Tuesday here but states her cardiology appointment isn't until tomorrow at 10:00 AM.

## 2016-06-18 NOTE — ED Provider Notes (Signed)
[facility] Provider Note   CSN: [id number] Arrival date & time: 06/18/16  1051  By signing my name below, I, Cynda Acres, attest that this documentation has been prepared under the direction and in the presence of Azalia Bilis, MD. Electronically Signed: Cynda Acres, Scribe. 06/18/16. 11:42 AM.   History   Chief Complaint Chief Complaint  Patient presents with  . Chest Pain    HPI Comments: Wendy Ramirez is a 51 y.o. female with a hx of atrial fibrillation, tobacco use, and SVT, who presents to the Emergency Department complaining of left-sided chest pressure that began this morning. Patient states she felt like her heart was beating out of her chest. Patient felt fine when leaving the hospital 2 days ago. Patient has associated hazy sensation (left eye), shortness of breath on exertion, chills, and palpations. Patient was seen here 2 days ago, in which she was prescribed toprolol. Patient states she drinks a lot of starbucks energy drinks and smokes. Patient states her cardiology appointment is tomorrow at 10 AM. She denies any new medications or any other symptoms.   The history is provided by the patient. No language interpreter was used.    Past Medical History:  Diagnosis Date  . Atrial fibrillation (HCC)   . Back pain   . Diverticulitis   . Pneumonia   . SVT (supraventricular tachycardia) (HCC)     There are no active problems to display for this patient.   Past Surgical History:  Procedure Laterality Date  . CHOLECYSTECTOMY    . TUBAL LIGATION      OB History    Gravida Para Term Preterm AB Living   4 4 4          SAB TAB Ectopic Multiple Live Births                   Home Medications    Prior to Admission medications   Medication Sig Start Date End Date Taking? Authorizing Provider  carisoprodol (SOMA) 350 MG tablet Take 1 tablet (350 mg total) by mouth 3 (three) times daily. 04/16/16   Darreld Mclean, MD  HYDROcodone-acetaminophen (NORCO)  7.5-325 MG tablet Take 1 tablet by mouth every 6 (six) hours as needed for moderate pain (Must last 30 days.Do not drive or operate machinery while taking this medicine.). 06/13/16   Darreld Mclean, MD  metoprolol succinate (TOPROL XL) 50 MG 24 hr tablet Take 1 tablet (50 mg total) by mouth daily. Take with or immediately following a meal. 06/16/16   Samuel Jester, DO    Family History Family History  Problem Relation Age of Onset  . Heart disease Father     Social History Social History  Substance Use Topics  . Smoking status: Current Every Day Smoker    Packs/day: 1.00    Types: Cigarettes  . Smokeless tobacco: Never Used  . Alcohol use No     Allergies   Patient has no known allergies.   Review of Systems Review of Systems  A complete 10 system review of systems was obtained and all systems are negative except as noted in the HPI and PMH.   Physical Exam Updated Vital Signs Pulse (!) 149   Temp 97.8 F (36.6 C) (Oral)   Resp 20   Ht 5\' 7"  (1.702 m)   Wt 290 lb (131.5 kg)   SpO2 97%   BMI 45.42 kg/m   Physical Exam  Constitutional: She is oriented to person, place, and time. She appears well-developed  and well-nourished. No distress.  HENT:  Head: Normocephalic and atraumatic.  Eyes: EOM are normal.  Neck: Normal range of motion.  Cardiovascular: Normal rate, regular rhythm and normal heart sounds.   Pulmonary/Chest: Effort normal and breath sounds normal.  Abdominal: Soft. She exhibits no distension. There is no tenderness.  Musculoskeletal: Normal range of motion.  Neurological: She is alert and oriented to person, place, and time.  Skin: Skin is warm and dry.  Psychiatric: She has a normal mood and affect. Judgment normal.  Nursing note and vitals reviewed.    ED Treatments / Results  DIAGNOSTIC STUDIES: Oxygen Saturation is 97% on RA, normal by my interpretation.    COORDINATION OF CARE: 11:37 AM Discussed treatment plan with pt at bedside and  pt agreed to plan.  Labs (all labs ordered are listed, but only abnormal results are displayed) Labs Reviewed  CBC - Abnormal; Notable for the following:       Result Value   WBC 12.7 (*)    Hemoglobin 16.2 (*)    HCT 48.7 (*)    All other components within normal limits  BASIC METABOLIC PANEL - Abnormal; Notable for the following:    Glucose, Bld 152 (*)    Calcium 8.8 (*)    All other components within normal limits  TROPONIN I  TSH     ECG interpretation #1  Date: 06/18/2016  Rate:  105  Rhythm: atrial fibrillation with rapid ventricular response  QRS Axis: normal  Intervals: normal  ST/T Wave abnormalities: normal  Conduction Disutrbances: none  Narrative Interpretation:   Old EKG Reviewed: No significant changes noted      EKG  EKG Interpretation #2  Date/Time:  Wednesday June 18 2016 12:44:09 EST Ventricular Rate:  88 PR Interval:    QRS Duration: 89 QT Interval:  378 QTC Calculation: 458 R Axis:     Text Interpretation:  Sinus rhythm Right atrial enlargement Low voltage, precordial leads no longer in atrial fibrillation Confirmed by Alyrica Thurow  MD, Caryn BeeKEVIN ([id number]) on 06/18/2016 2:01:13 PM       Radiology Dg Chest Port 1 View  Result Date: 06/16/2016 CLINICAL DATA:  Chest tightness and shortness of breath. EXAM: PORTABLE CHEST 1 VIEW COMPARISON:  08/08/2010. FINDINGS: Trachea is midline. Heart size is accentuated by AP technique. Lungs are clear. No pleural fluid. IMPRESSION: No acute findings. Electronically Signed   By: Leanna BattlesMelinda  Blietz M.D.   On: 06/16/2016 14:09    Procedures Procedures (including critical care time)  Medications Ordered in ED Medications  diltiazem (CARDIZEM) 1 mg/mL load via infusion 15 mg (15 mg Intravenous Bolus from Bag 06/18/16 1127)    And  diltiazem (CARDIZEM) 100 mg in dextrose 5% 100mL (1 mg/mL) infusion (10 mg/hr Intravenous Rate/Dose Change 06/18/16 1205)  enoxaparin (LOVENOX) injection 130 mg (not administered)    metoprolol succinate (TOPROL-XL) 24 hr tablet 50 mg (50 mg Oral Given 06/18/16 1310)     Initial Impression / Assessment and Plan / ED Course  I have reviewed the triage vital signs and the nursing notes.  Pertinent labs & imaging results that were available during my care of the patient were reviewed by me and considered in my medical decision making (see chart for details).     1:10 PM Patient returned to normal sinus rhythm while in the emergency department after IV Cardizem drip.  Will increase Toprol-XL from 50 mg daily 100 mg daily.  Patient taken off an IV Cardizem.  We'll continue observing in the  emergency department for one hour to make sure she remains in sinus rhythm.  She has scheduled cardiology follow-up tomorrow.  1:58 PM Patient remains in sinus rhythm at this time.  She feels good.  She will be started on 100 mg of Toprol-XL.  She was given 50 additional here in emergency department.  She will begin her 100 mg dose tomorrow morning.  She's scheduled to see cardiology for follow-up.  Her chads vasc scores 1, however currently our cardiology team is recommending initiation of anticoagulation in these patients.  I will give her a single dose of Lovenox at this time and defer ongoing anticoagulation decisions to cardiology.  Patient understands return to the ER for new or worsening symptoms     CHA2DS2/VAS Stroke Risk Points      1 >= 2 Points: High Risk  1 - 1.99 Points: Medium Risk  0 Points: Low Risk    The patient's score has not changed in the past year.:  No Change   Details    Note: External data might be a factor in metrics not marked with    Points Metrics   This score determines the patient's risk of having a stroke if the  patient has atrial fibrillation.       0 Has Congestive Heart Failure:  No   0 Has Vascular Disease:  No   0 Has Hypertension:  No   0 Age:  66   0 Has Diabetes:  No   0 Had Stroke:  No Had TIA:  No Had thromboembolism:  No   1  Female:  Yes     .ch     Final Clinical Impressions(s) / ED Diagnoses   Final diagnoses:  Atrial fibrillation with rapid ventricular response (HCC)    New Prescriptions New Prescriptions   No medications on file   I personally performed the services described in this documentation, which was scribed in my presence. The recorded information has been reviewed and is accurate.       Azalia Bilis, MD 06/18/16 [phone]

## 2016-06-19 ENCOUNTER — Encounter: Payer: Self-pay | Admitting: Cardiology

## 2016-06-19 ENCOUNTER — Ambulatory Visit (INDEPENDENT_AMBULATORY_CARE_PROVIDER_SITE_OTHER): Payer: Self-pay | Admitting: Cardiology

## 2016-06-19 VITALS — BP 166/74 | HR 93 | Ht 67.0 in | Wt 299.0 lb

## 2016-06-19 DIAGNOSIS — I480 Paroxysmal atrial fibrillation: Secondary | ICD-10-CM

## 2016-06-19 MED ORDER — APIXABAN 5 MG PO TABS: 5.0000 mg | ORAL_TABLET | Freq: Two times a day (BID) | ORAL | 3 refills | Status: DC

## 2016-06-19 MED ORDER — METOPROLOL TARTRATE 100 MG PO TABS: 100.0000 mg | ORAL_TABLET | Freq: Two times a day (BID) | ORAL | 3 refills | Status: DC

## 2016-06-19 NOTE — Patient Instructions (Addendum)
Medication Instructions:  STOP TOPROL XL  START METOPROLOL ( LOPRESSOR) 100 MG TWO TIMES DAILY START ELIQUIS 5 MG  TWO TIMES DAILY   Labwork: NONE  Testing/Procedures: NONE  Follow-Up: Your physician wants you to follow-up in: 4 MONTHS.  You will receive a reminder letter in the mail two months in advance. If you don't receive a letter, please call our office to schedule the follow-up appointment.   Any Other Special Instructions Will Be Listed Below (If Applicable). GIVEN SAMPLES OF ELIQUIS- LOT NUMBER DOCUMENTED IN BOOK   If you need a refill on your cardiac medications before your next appointment, please call your pharmacy.

## 2016-06-19 NOTE — Progress Notes (Signed)
Clinical Summary Wendy Ramirez is a 51 y.o.female seen today as a new patient. She is referred by Dr Clarene Duke.   1. Paroxysmal Afib - Seen in ER yesterday with chest pain and palpitations.  - she reports high caffeine intake - found to be in afib with RVR in ER. Converted back to NSR after IV cardizem. Toprol XL was increased to 100mg  dialy.    2. HTN - compliant with meds Past Medical History:  Diagnosis Date  . Atrial fibrillation (HCC)   . Back pain   . Diverticulitis   . Pneumonia   . SVT (supraventricular tachycardia) (HCC)      No Known Allergies   Current Outpatient Prescriptions  Medication Sig Dispense Refill  . aspirin EC 81 MG tablet Take 81 mg by mouth once.    . carisoprodol (SOMA) 350 MG tablet Take 1 tablet (350 mg total) by mouth 3 (three) times daily. 90 tablet 2  . HYDROcodone-acetaminophen (NORCO) 7.5-325 MG tablet Take 1 tablet by mouth every 6 (six) hours as needed for moderate pain (Must last 30 days.Do not drive or operate machinery while taking this medicine.). 75 tablet 0  . metoprolol succinate (TOPROL XL) 50 MG 24 hr tablet Take 2 tablets (100 mg total) by mouth daily. Take with or immediately following a meal. 15 tablet 0   No current facility-administered medications for this visit.      Past Surgical History:  Procedure Laterality Date  . CHOLECYSTECTOMY    . TUBAL LIGATION       No Known Allergies    Family History  Problem Relation Age of Onset  . Heart disease Father      Social History Wendy Ramirez reports that she has been smoking Cigarettes.  She has been smoking about 1.00 pack per day. She has never used smokeless tobacco. Wendy Ramirez reports that she does not drink alcohol.   Review of Systems CONSTITUTIONAL: No weight loss, fever, chills, weakness or fatigue.  HEENT: Eyes: No visual loss, blurred vision, double vision or yellow sclerae.No hearing loss, sneezing, congestion, runny nose or sore throat.  SKIN: No rash or  itching.  CARDIOVASCULAR: no chest pain, no current palpitations.  RESPIRATORY: No shortness of breath, cough or sputum.  GASTROINTESTINAL: No anorexia, nausea, vomiting or diarrhea. No abdominal pain or blood.  GENITOURINARY: No burning on urination, no polyuria NEUROLOGICAL: No headache, dizziness, syncope, paralysis, ataxia, numbness or tingling in the extremities. No change in bowel or bladder control.  MUSCULOSKELETAL: No muscle, back pain, joint pain or stiffness.  LYMPHATICS: No enlarged nodes. No history of splenectomy.  PSYCHIATRIC: No history of depression or anxiety.  ENDOCRINOLOGIC: No reports of sweating, cold or heat intolerance. No polyuria or polydipsia.  Marland Kitchen   Physical Examination Vitals:   06/19/16 1020  BP: (!) 166/74  Pulse: 93   Vitals:   06/19/16 1020  Weight: 299 lb (135.6 kg)  Height: 5\' 7"  (1.702 m)    Gen: resting comfortably, no acute distress HEENT: no scleral icterus, pupils equal round and reactive, no palptable cervical adenopathy,  CV: RRR, no m/r/g, no jvd Resp: Clear to auscultation bilaterally GI: abdomen is soft, non-tender, non-distended, normal bowel sounds, no hepatosplenomegaly MSK: extremities are warm, no edema.  Skin: warm, no rash Neuro:  no focal deficits Psych: appropriate affect     Assessment and Plan   1. PAF - new diagnosis. EKG in clinic today shows NSR - change Toprol to lopressor 100mg  bid due to cost -  CHADS2Vasc score is 2 (HTN, gender), we will start eliquis 5mg  bid. Cost may be an issue, may need to change to coumadin in the future.    F/u 4 months  Antoine PocheJonathan F. Brittanie Dosanjh, M.D.

## 2016-07-09 ENCOUNTER — Telehealth: Payer: Self-pay | Admitting: Orthopaedic Surgery

## 2016-07-10 MED ORDER — HYDROCODONE-ACETAMINOPHEN 7.5-325 MG PO TABS: 1.0000 | ORAL_TABLET | Freq: Four times a day (QID) | ORAL | 0 refills | Status: DC | PRN

## 2016-07-14 ENCOUNTER — Telehealth: Payer: Self-pay | Admitting: Orthopaedic Surgery

## 2016-07-14 NOTE — Telephone Encounter (Signed)
Carisoprodol 350 mg  Qty 90 Tablets

## 2016-07-15 NOTE — Telephone Encounter (Signed)
Denied.  No anti-spasm more than six weeks.

## 2016-07-28 ENCOUNTER — Telehealth: Payer: Self-pay | Admitting: Cardiology

## 2016-07-28 NOTE — Telephone Encounter (Signed)
Pt is unable to afford her Eliquis

## 2016-07-29 NOTE — Telephone Encounter (Signed)
Pt cannot afford Eliquis. She has a high deductible for insurance. Please advise.

## 2016-07-30 MED ORDER — RIVAROXABAN 20 MG PO TABS: 20.0000 mg | ORAL_TABLET | Freq: Every day | ORAL | 3 refills | Status: DC

## 2016-07-30 NOTE — Telephone Encounter (Signed)
Can we see if xarelto 20mg  daily would be any cheaper for her? If not then [facility]   Dominga FerryJ Talea Manges MD

## 2016-07-30 NOTE — Telephone Encounter (Signed)
Pt will switch to coumadin. Will have Aurther Lofterry  make appointment. Gave samples of Eliquis to do until she can see coumadin clinic.

## 2016-08-13 ENCOUNTER — Ambulatory Visit (INDEPENDENT_AMBULATORY_CARE_PROVIDER_SITE_OTHER): Payer: Self-pay | Admitting: Orthopaedic Surgery

## 2016-08-13 ENCOUNTER — Encounter: Payer: Self-pay | Admitting: Orthopaedic Surgery

## 2016-08-13 ENCOUNTER — Ambulatory Visit (INDEPENDENT_AMBULATORY_CARE_PROVIDER_SITE_OTHER): Payer: Self-pay | Admitting: *Deleted

## 2016-08-13 VITALS — BP 188/103 | HR 81 | Temp 97.7°F | Ht 67.0 in | Wt 300.0 lb

## 2016-08-13 DIAGNOSIS — I4891 Unspecified atrial fibrillation: Secondary | ICD-10-CM | POA: Insufficient documentation

## 2016-08-13 DIAGNOSIS — I480 Paroxysmal atrial fibrillation: Secondary | ICD-10-CM

## 2016-08-13 DIAGNOSIS — G8929 Other chronic pain: Secondary | ICD-10-CM

## 2016-08-13 DIAGNOSIS — M545 Low back pain: Secondary | ICD-10-CM

## 2016-08-13 DIAGNOSIS — Z5181 Encounter for therapeutic drug level monitoring: Secondary | ICD-10-CM

## 2016-08-13 LAB — POCT INR: INR: 1

## 2016-08-13 MED ORDER — WARFARIN SODIUM 5 MG PO TABS: 5.0000 mg | ORAL_TABLET | Freq: Every day | ORAL | 3 refills | Status: DC

## 2016-08-13 MED ORDER — HYDROCODONE-ACETAMINOPHEN 7.5-325 MG PO TABS: 1.0000 | ORAL_TABLET | Freq: Four times a day (QID) | ORAL | 0 refills | Status: DC | PRN

## 2016-08-13 NOTE — Progress Notes (Signed)
Patient ZO:XWRUEAV DENEEN Ramirez, female DOB:1966/05/17, 51 y.o. WUJ:[id number]  Chief Complaint  Patient presents with  . Follow-up    LOW BACK PAIN    HPI  Wendy Ramirez is a 51 y.o. female who has chronic lower back pain.  She has no new trauma, no paresthesias.  She is taking her medicine.  She was recently diagnosised with atrial fib and is on a blood thinner.  HPI  Body mass index is 46.99 kg/m.  ROS  Review of Systems  Past Medical History:  Diagnosis Date  . Atrial fibrillation (HCC)   . Back pain   . Diverticulitis   . Pneumonia   . SVT (supraventricular tachycardia) (HCC)     Past Surgical History:  Procedure Laterality Date  . CHOLECYSTECTOMY    . TUBAL LIGATION      Family History  Problem Relation Age of Onset  . Heart disease Father   . Atrial fibrillation Maternal Grandmother   . Heart attack Paternal Grandfather     Social History Social History  Substance Use Topics  . Smoking status: Current Every Day Smoker    Packs/day: 1.00    Types: Cigarettes    Start date: 06/19/1993  . Smokeless tobacco: Never Used  . Alcohol use No    No Known Allergies  Current Outpatient Prescriptions  Medication Sig Dispense Refill  . aspirin EC 81 MG tablet Take 81 mg by mouth once.    Marland Kitchen HYDROcodone-acetaminophen (NORCO) 7.5-325 MG tablet Take 1 tablet by mouth every 6 (six) hours as needed for moderate pain (Must last 30 days.Do not drive or operate machinery while taking this medicine.). 75 tablet 0  . metoprolol (LOPRESSOR) 100 MG tablet Take 1 tablet (100 mg total) by mouth 2 (two) times daily. 180 tablet 3   No current facility-administered medications for this visit.      Physical Exam  Blood pressure (!) 188/103, pulse 81, temperature 97.7 F (36.5 C), height 5\' 7"  (1.702 m), weight 300 lb (136.1 kg).  Constitutional: overall normal hygiene, normal nutrition, well developed, normal grooming, normal body habitus. Assistive  device:none  Musculoskeletal: gait and station Limp none, muscle tone and strength are normal, no tremors or atrophy is present.  .  Neurological: coordination overall normal.  Deep tendon reflex/nerve stretch intact.  Sensation normal.  Cranial nerves II-XII intact.   Skin:   Normal overall no scars, lesions, ulcers or rashes. No psoriasis.  Psychiatric: Alert and oriented x 3.  Recent memory intact, remote memory unclear.  Normal mood and affect. Well groomed.  Good eye contact.  Cardiovascular: overall no swelling, no varicosities, no edema bilaterally, normal temperatures of the legs and arms, no clubbing, cyanosis and good capillary refill.  Lymphatic: palpation is normal.  Spine/Pelvis examination:  Inspection:  Overall, sacoiliac joint benign and hips nontender; without crepitus or defects.   Thoracic spine inspection: Alignment normal without kyphosis present   Lumbar spine inspection:  Alignment  with normal lumbar lordosis, without scoliosis apparent.   Thoracic spine palpation:  without tenderness of spinal processes   Lumbar spine palpation: with tenderness of lumbar area; without tightness of lumbar muscles    Range of Motion:   Lumbar flexion, forward flexion is 45 without pain or tenderness    Lumbar extension is full without pain or tenderness   Left lateral bend is Normal  without pain or tenderness   Right lateral bend is Normal without pain or tenderness   Straight leg raising is Normal  Strength & tone: Normal   Stability overall normal stability     The patient has been educated about the nature of the problem(s) and counseled on treatment options.  The patient appeared to understand what I have discussed and is in agreement with it.  Encounter Diagnoses  Name Primary?  . Chronic midline low back pain without sciatica Yes  . Morbid obesity due to excess calories [facility](HCC)     PLAN Call if any problems.  Precautions discussed.  Continue current  medications.   Return to clinic 3 months   I [facility] web site prior to prescribing narcotic medicine for this patient.  Electronically Signed Darreld McleanWayne Klani Caridi, MD 3/21/20181:44 PM

## 2016-08-13 NOTE — Patient Instructions (Signed)
Steps to Quit Smoking Smoking tobacco can be bad for your health. It can also affect almost every organ in your body. Smoking puts you and people around you at risk for many serious long-lasting (chronic) diseases. Quitting smoking is hard, but it is one of the best things that you can do for your health. It is never too late to quit. What are the benefits of quitting smoking? When you quit smoking, you lower your risk for getting serious diseases and conditions. They can include:  Lung cancer or lung disease.  Heart disease.  Stroke.  Heart attack.  Not being able to have children (infertility).  Weak bones (osteoporosis) and broken bones (fractures). If you have coughing, wheezing, and shortness of breath, those symptoms may get better when you quit. You may also get sick less often. If you are pregnant, quitting smoking can help to lower your chances of having a baby of low birth weight. What can I do to help me quit smoking? Talk with your doctor about what can help you quit smoking. Some things you can do (strategies) include:  Quitting smoking totally, instead of slowly cutting back how much you smoke over a period of time.  Going to in-person counseling. You are more likely to quit if you go to many counseling sessions.  Using resources and support systems, such as:  Online chats with a counselor.  Phone quitlines.  Printed self-help materials.  Support groups or group counseling.  Text messaging programs.  Mobile phone apps or applications.  Taking medicines. Some of these medicines may have nicotine in them. If you are pregnant or breastfeeding, do not take any medicines to quit smoking unless your doctor says it is okay. Talk with your doctor about counseling or other things that can help you. Talk with your doctor about using more than one strategy at the same time, such as taking medicines while you are also going to in-person counseling. This can help make quitting  easier. What things can I do to make it easier to quit? Quitting smoking might feel very hard at first, but there is a lot that you can do to make it easier. Take these steps:  Talk to your family and friends. Ask them to support and encourage you.  Call phone quitlines, reach out to support groups, or work with a counselor.  Ask people who smoke to not smoke around you.  Avoid places that make you want (trigger) to smoke, such as:  Bars.  Parties.  Smoke-break areas at work.  Spend time with people who do not smoke.  Lower the stress in your life. Stress can make you want to smoke. Try these things to help your stress:  Getting regular exercise.  Deep-breathing exercises.  Yoga.  Meditating.  Doing a body scan. To do this, close your eyes, focus on one area of your body at a time from head to toe, and notice which parts of your body are tense. Try to relax the muscles in those areas.  Download or buy apps on your mobile phone or tablet that can help you stick to your quit plan. There are many free apps, such as QuitGuide from the CDC (Centers for Disease Control and Prevention). You can find more support from smokefree.gov and other websites. This information is not intended to replace advice given to you by your health care provider. Make sure you discuss any questions you have with your health care provider. Document Released: 03/08/2009 Document Revised: 01/08/2016 Document   Reviewed: 09/26/2014 Elsevier Interactive Patient Education  2017 [employer] Inc.  

## 2016-08-27 ENCOUNTER — Ambulatory Visit (INDEPENDENT_AMBULATORY_CARE_PROVIDER_SITE_OTHER): Payer: Self-pay | Admitting: *Deleted

## 2016-08-27 DIAGNOSIS — I480 Paroxysmal atrial fibrillation: Secondary | ICD-10-CM

## 2016-08-27 DIAGNOSIS — Z5181 Encounter for therapeutic drug level monitoring: Secondary | ICD-10-CM

## 2016-08-27 LAB — POCT INR: INR: 2.3

## 2016-09-03 ENCOUNTER — Ambulatory Visit (INDEPENDENT_AMBULATORY_CARE_PROVIDER_SITE_OTHER): Payer: Self-pay | Admitting: *Deleted

## 2016-09-03 DIAGNOSIS — I480 Paroxysmal atrial fibrillation: Secondary | ICD-10-CM

## 2016-09-03 DIAGNOSIS — Z5181 Encounter for therapeutic drug level monitoring: Secondary | ICD-10-CM

## 2016-09-03 LAB — POCT INR: INR: 1.7

## 2016-09-10 ENCOUNTER — Telehealth: Payer: Self-pay | Admitting: Orthopaedic Surgery

## 2016-09-10 ENCOUNTER — Ambulatory Visit (INDEPENDENT_AMBULATORY_CARE_PROVIDER_SITE_OTHER): Payer: Self-pay | Admitting: *Deleted

## 2016-09-10 DIAGNOSIS — I480 Paroxysmal atrial fibrillation: Secondary | ICD-10-CM

## 2016-09-10 DIAGNOSIS — Z5181 Encounter for therapeutic drug level monitoring: Secondary | ICD-10-CM

## 2016-09-10 LAB — POCT INR: INR: 2.8

## 2016-09-10 MED ORDER — HYDROCODONE-ACETAMINOPHEN 7.5-325 MG PO TABS: 1.0000 | ORAL_TABLET | Freq: Four times a day (QID) | ORAL | 0 refills | Status: DC | PRN

## 2016-09-17 ENCOUNTER — Ambulatory Visit (INDEPENDENT_AMBULATORY_CARE_PROVIDER_SITE_OTHER): Payer: Self-pay | Admitting: *Deleted

## 2016-09-17 DIAGNOSIS — I480 Paroxysmal atrial fibrillation: Secondary | ICD-10-CM

## 2016-09-17 DIAGNOSIS — Z5181 Encounter for therapeutic drug level monitoring: Secondary | ICD-10-CM

## 2016-09-17 LAB — POCT INR: INR: 2.3

## 2016-10-01 ENCOUNTER — Ambulatory Visit (INDEPENDENT_AMBULATORY_CARE_PROVIDER_SITE_OTHER): Payer: Self-pay | Admitting: *Deleted

## 2016-10-01 DIAGNOSIS — I480 Paroxysmal atrial fibrillation: Secondary | ICD-10-CM

## 2016-10-01 DIAGNOSIS — Z5181 Encounter for therapeutic drug level monitoring: Secondary | ICD-10-CM

## 2016-10-01 LAB — POCT INR: INR: 2.8

## 2016-10-08 ENCOUNTER — Telehealth: Payer: Self-pay | Admitting: Orthopaedic Surgery

## 2016-10-09 MED ORDER — HYDROCODONE-ACETAMINOPHEN 7.5-325 MG PO TABS: 1.0000 | ORAL_TABLET | Freq: Four times a day (QID) | ORAL | 0 refills | Status: DC | PRN

## 2016-10-17 ENCOUNTER — Ambulatory Visit (INDEPENDENT_AMBULATORY_CARE_PROVIDER_SITE_OTHER): Payer: Self-pay | Admitting: Cardiology

## 2016-10-17 ENCOUNTER — Encounter: Payer: Self-pay | Admitting: Cardiology

## 2016-10-17 VITALS — BP 154/80 | HR 69 | Ht 67.0 in | Wt 300.0 lb

## 2016-10-17 DIAGNOSIS — I4891 Unspecified atrial fibrillation: Secondary | ICD-10-CM

## 2016-10-17 DIAGNOSIS — I10 Essential (primary) hypertension: Secondary | ICD-10-CM

## 2016-10-17 MED ORDER — AMLODIPINE BESYLATE 5 MG PO TABS: 5.0000 mg | ORAL_TABLET | Freq: Every day | ORAL | 6 refills | Status: DC

## 2016-10-17 NOTE — Patient Instructions (Addendum)
Your physician wants you to follow-up in:  6 months with Dr Lurena JoinerBranch You will receive a reminder letter in the mail two months in advance. If you don't receive a letter, please call our office to schedule the follow-up appointment.  Get BP checked at next coumadin clinic visit     START Amlodipine 5 mg daily for blood pressure            Thank you for choosing [facility] !

## 2016-10-17 NOTE — Progress Notes (Signed)
Clinical Summary Ms. Schlauch is a 51 y.o.female seen today for follow up of the following medical problems  1. Paroxysmal Afib - Seen in ER yesterday with chest pain and palpitations.  - she reports high caffeine intake - found to be in afib with RVR in ER. Converted back to NSR after IV cardizem. Toprol XL was increased to 100mg  dialy.   - patient changed from eliquis to coumadin due to cost - denies any recent palpitations.  - doing well on coumadin.     2. HTN - compliant with meds - checks at home, typically 180s/80s    Past Medical History:  Diagnosis Date  . Atrial fibrillation (HCC)   . Back pain   . Diverticulitis   . Pneumonia   . SVT (supraventricular tachycardia) (HCC)      No Known Allergies   Current Outpatient Prescriptions  Medication Sig Dispense Refill  . HYDROcodone-acetaminophen (NORCO) 7.5-325 MG tablet Take 1 tablet by mouth every 6 (six) hours as needed for moderate pain (Must last 30 days.Do not drive or operate machinery while taking this medicine.). 60 tablet 0  . metoprolol (LOPRESSOR) 100 MG tablet Take 1 tablet (100 mg total) by mouth 2 (two) times daily. 180 tablet 3  . warfarin (COUMADIN) 5 MG tablet Take 1 tablet (5 mg total) by mouth daily. 45 tablet 3   No current facility-administered medications for this visit.      Past Surgical History:  Procedure Laterality Date  . CHOLECYSTECTOMY    . TUBAL LIGATION       No Known Allergies    Family History  Problem Relation Age of Onset  . Heart disease Father   . Atrial fibrillation Maternal Grandmother   . Heart attack Paternal Grandfather      Social History Ms. Shoemaker reports that she has been smoking Cigarettes.  She started smoking about 23 years ago. She has been smoking about 1.00 pack per day. She has never used smokeless tobacco. Ms. Puzzo reports that she does not drink alcohol.   Review of Systems CONSTITUTIONAL: No weight loss, fever, chills, weakness or  fatigue.  HEENT: Eyes: No visual loss, blurred vision, double vision or yellow sclerae.No hearing loss, sneezing, congestion, runny nose or sore throat.  SKIN: No rash or itching.  CARDIOVASCULAR: per hpi RESPIRATORY: No shortness of breath, cough or sputum.  GASTROINTESTINAL: No anorexia, nausea, vomiting or diarrhea. No abdominal pain or blood.  GENITOURINARY: No burning on urination, no polyuria NEUROLOGICAL: No headache, dizziness, syncope, paralysis, ataxia, numbness or tingling in the extremities. No change in bowel or bladder control.  MUSCULOSKELETAL: No muscle, back pain, joint pain or stiffness.  LYMPHATICS: No enlarged nodes. No history of splenectomy.  PSYCHIATRIC: No history of depression or anxiety.  ENDOCRINOLOGIC: No reports of sweating, cold or heat intolerance. No polyuria or polydipsia.  Marland Kitchen   Physical Examination Vitals:   10/17/16 1328  BP: (!) 154/80  Pulse: 69   Vitals:   10/17/16 1328  Weight: 300 lb (136.1 kg)  Height: 5\' 7"  (1.702 m)    Gen: resting comfortably, no acute distress HEENT: no scleral icterus, pupils equal round and reactive, no palptable cervical adenopathy,  CV: RRR, no m/r/g, no jvd Resp: Clear to auscultation bilaterally GI: abdomen is soft, non-tender, non-distended, normal bowel sounds, no hepatosplenomegaly MSK: extremities are warm, no edema.  Skin: warm, no rash Neuro:  no focal deficits Psych: appropriate affect     Assessment and Plan   1.  PAF - CHADS2Vasc score is 2 (HTN, gender), continue anticoag - no recent symptoms  2. HTN - elevated in clinic.  - we will start norvasc 5mg  daily, return in 2 weeks for nursing bp visit   F/u 6 months     Antoine PocheJonathan F. Avanelle Pixley, M.D.

## 2016-10-22 ENCOUNTER — Ambulatory Visit (INDEPENDENT_AMBULATORY_CARE_PROVIDER_SITE_OTHER): Payer: Self-pay | Admitting: *Deleted

## 2016-10-22 ENCOUNTER — Ambulatory Visit (INDEPENDENT_AMBULATORY_CARE_PROVIDER_SITE_OTHER): Payer: Self-pay

## 2016-10-22 VITALS — BP 160/71 | HR 73

## 2016-10-22 DIAGNOSIS — Z5181 Encounter for therapeutic drug level monitoring: Secondary | ICD-10-CM

## 2016-10-22 DIAGNOSIS — I480 Paroxysmal atrial fibrillation: Secondary | ICD-10-CM

## 2016-10-22 DIAGNOSIS — I10 Essential (primary) hypertension: Secondary | ICD-10-CM

## 2016-10-22 LAB — POCT INR: INR: 2.4

## 2016-10-22 NOTE — Patient Instructions (Signed)
Our machine BP 160/71, pts machine 162/93   We will call you with instructions     Thank you for choosing  [facility] !

## 2016-10-27 ENCOUNTER — Telehealth: Payer: Self-pay

## 2016-10-27 MED ORDER — AMLODIPINE BESYLATE 10 MG PO TABS: 10.0000 mg | ORAL_TABLET | Freq: Every day | ORAL | 3 refills | Status: DC

## 2016-10-27 NOTE — Progress Notes (Signed)
BP remains elevated, have her increase norvasc to 10mg  daily and f/u 2 weeks with nursing visit for bp check  J Loyce Klasen MD

## 2016-10-27 NOTE — Telephone Encounter (Addendum)
-----   Message from Antoine PocheJonathan F Branch, MD sent at 10/27/2016 10:05 AM EDT -----   ----- Message ----- From: Nori Riisarlton, Melonee Gerstel A, RN Sent: 10/22/2016   2:18 PM To: Antoine PocheJonathan F Branch, MD   BP remains elevated, have her increase norvasc to 10mg  daily and f/u 2 weeks with nursing visit for bp check  J BrancH MD

## 2016-10-27 NOTE — Telephone Encounter (Signed)
LM on private vm to imcrease norvasc to 10 mg daily and have BP check in 2 weeks

## 2016-11-13 ENCOUNTER — Telehealth: Payer: Self-pay | Admitting: Cardiology

## 2016-11-13 ENCOUNTER — Ambulatory Visit (INDEPENDENT_AMBULATORY_CARE_PROVIDER_SITE_OTHER): Payer: Self-pay | Admitting: Orthopaedic Surgery

## 2016-11-13 ENCOUNTER — Encounter: Payer: Self-pay | Admitting: Orthopaedic Surgery

## 2016-11-13 VITALS — BP 152/93 | HR 99 | Temp 97.2°F | Ht 67.0 in | Wt 304.0 lb

## 2016-11-13 DIAGNOSIS — Z79899 Other long term (current) drug therapy: Secondary | ICD-10-CM

## 2016-11-13 DIAGNOSIS — M545 Low back pain, unspecified: Secondary | ICD-10-CM

## 2016-11-13 DIAGNOSIS — G8929 Other chronic pain: Secondary | ICD-10-CM

## 2016-11-13 DIAGNOSIS — F17210 Nicotine dependence, cigarettes, uncomplicated: Secondary | ICD-10-CM

## 2016-11-13 MED ORDER — CHLORTHALIDONE 25 MG PO TABS: 25.0000 mg | ORAL_TABLET | Freq: Every day | ORAL | 3 refills | Status: DC

## 2016-11-13 MED ORDER — HYDROCODONE-ACETAMINOPHEN 7.5-325 MG PO TABS: 1.0000 | ORAL_TABLET | Freq: Four times a day (QID) | ORAL | 0 refills | Status: DC | PRN

## 2016-11-13 NOTE — Telephone Encounter (Signed)
Can stop norvasc. Can start chlorthalidone 25mg  daily. Will need BMET/Mg in 2 weeks. She should update us on her bp's 1 week after making change.  Dominga FerryJ Vivan Agostino MD

## 2016-11-13 NOTE — Progress Notes (Signed)
Patient WN:UUVOZDG:Wendy Ramirez, female DOB:01/25/1966, 51 y.o. [id number]  Chief Complaint  Patient presents with  . Follow-up    Low back pain    HPI  Wendy Ramirez is a 51 y.o. female who has chronic lower back pain.  She is stable. She has no paresthesias.  She has no new trauma.  She is active and doing her exercises. HPI  Body mass index is 47.61 kg/m.  ROS  Review of Systems  Past Medical History:  Diagnosis Date  . Atrial fibrillation (HCC)   . Back pain   . Diverticulitis   . Pneumonia   . SVT (supraventricular tachycardia) (HCC)     Past Surgical History:  Procedure Laterality Date  . CHOLECYSTECTOMY    . TUBAL LIGATION      Family History  Problem Relation Age of Onset  . Heart disease Father   . Atrial fibrillation Maternal Grandmother   . Heart attack Paternal Grandfather     Social History Social History  Substance Use Topics  . Smoking status: Current Every Day Smoker    Packs/day: 1.00    Types: Cigarettes    Start date: 06/19/1993  . Smokeless tobacco: Never Used  . Alcohol use No    No Known Allergies  Current Outpatient Prescriptions  Medication Sig Dispense Refill  . chlorthalidone (HYGROTON) 25 MG tablet Take 1 tablet (25 mg total) by mouth daily. 30 tablet 3  . HYDROcodone-acetaminophen (NORCO) 7.5-325 MG tablet Take 1 tablet by mouth every 6 (six) hours as needed for moderate pain (Must last 30 days.Do not drive or operate machinery while taking this medicine.). 60 tablet 0  . metoprolol tartrate (LOPRESSOR) 100 MG tablet Take 100 mg by mouth 2 (two) times daily.    Marland Kitchen. warfarin (COUMADIN) 5 MG tablet Take 1 tablet (5 mg total) by mouth daily. 45 tablet 3   No current facility-administered medications for this visit.      Physical Exam  Blood pressure (!) 152/93, pulse 99, temperature 97.2 F (36.2 C), height 5\' 7"  (1.702 m), weight (!) 304 lb (137.9 kg).  Constitutional: overall normal hygiene, normal nutrition, well  developed, normal grooming, normal body habitus. Assistive device:none  Musculoskeletal: gait and station Limp none, muscle tone and strength are normal, no tremors or atrophy is present.  .  Neurological: coordination overall normal.  Deep tendon reflex/nerve stretch intact.  Sensation normal.  Cranial nerves II-XII intact.   Skin:   Normal overall no scars, lesions, ulcers or rashes. No psoriasis.  Psychiatric: Alert and oriented x 3.  Recent memory intact, remote memory unclear.  Normal mood and affect. Well groomed.  Good eye contact.  Cardiovascular: overall no swelling, no varicosities, no edema bilaterally, normal temperatures of the legs and arms, no clubbing, cyanosis and good capillary refill.  Lymphatic: palpation is normal.  Spine/Pelvis examination:  Inspection:  Overall, sacoiliac joint benign and hips nontender; without crepitus or defects.   Thoracic spine inspection: Alignment normal without kyphosis present   Lumbar spine inspection:  Alignment  with normal lumbar lordosis, without scoliosis apparent.   Thoracic spine palpation:  without tenderness of spinal processes   Lumbar spine palpation: with tenderness of lumbar area; without tightness of lumbar muscles    Range of Motion:   Lumbar flexion, forward flexion is 45 without pain or tenderness    Lumbar extension is 10 without pain or tenderness   Left lateral bend is Normal  without pain or tenderness   Right lateral bend is  Normal without pain or tenderness   Straight leg raising is Normal   Strength & tone: Normal   Stability overall normal stability     The patient has been educated about the nature of the problem(s) and counseled on treatment options.  The patient appeared to understand what I have discussed and is in agreement with it.  Encounter Diagnoses  Name Primary?  . Chronic midline low back pain without sciatica Yes  . Morbid obesity due to excess calories (HCC)   . Cigarette nicotine  dependence without complication     PLAN Call if any problems.  Precautions discussed.  Continue current medications.   Return to clinic 3 months   I have reviewed the [facility] Controlled Substance Reporting System web site prior to prescribing narcotic medicine for this patient.  Electronically Signed Darreld Mclean, MD 6/21/20181:54 PM

## 2016-11-13 NOTE — Patient Instructions (Signed)
Steps to Quit Smoking Smoking tobacco can be bad for your health. It can also affect almost every organ in your body. Smoking puts you and people around you at risk for many serious Ennio Houp-lasting (chronic) diseases. Quitting smoking is hard, but it is one of the best things that you can do for your health. It is never too late to quit. What are the benefits of quitting smoking? When you quit smoking, you lower your risk for getting serious diseases and conditions. They can include:  Lung cancer or lung disease.  Heart disease.  Stroke.  Heart attack.  Not being able to have children (infertility).  Weak bones (osteoporosis) and broken bones (fractures).  If you have coughing, wheezing, and shortness of breath, those symptoms may get better when you quit. You may also get sick less often. If you are pregnant, quitting smoking can help to lower your chances of having a baby of low birth weight. What can I do to help me quit smoking? Talk with your doctor about what can help you quit smoking. Some things you can do (strategies) include:  Quitting smoking totally, instead of slowly cutting back how much you smoke over a period of time.  Going to in-person counseling. You are more likely to quit if you go to many counseling sessions.  Using resources and support systems, such as: ? Online chats with a counselor. ? Phone quitlines. ? Printed self-help materials. ? Support groups or group counseling. ? Text messaging programs. ? Mobile phone apps or applications.  Taking medicines. Some of these medicines may have nicotine in them. If you are pregnant or breastfeeding, do not take any medicines to quit smoking unless your doctor says it is okay. Talk with your doctor about counseling or other things that can help you.  Talk with your doctor about using more than one strategy at the same time, such as taking medicines while you are also going to in-person counseling. This can help make  quitting easier. What things can I do to make it easier to quit? Quitting smoking might feel very hard at first, but there is a lot that you can do to make it easier. Take these steps:  Talk to your family and friends. Ask them to support and encourage you.  Call phone quitlines, reach out to support groups, or work with a counselor.  Ask people who smoke to not smoke around you.  Avoid places that make you want (trigger) to smoke, such as: ? Bars. ? Parties. ? Smoke-break areas at work.  Spend time with people who do not smoke.  Lower the stress in your life. Stress can make you want to smoke. Try these things to help your stress: ? Getting regular exercise. ? Deep-breathing exercises. ? Yoga. ? Meditating. ? Doing a body scan. To do this, close your eyes, focus on one area of your body at a time from head to toe, and notice which parts of your body are tense. Try to relax the muscles in those areas.  Download or buy apps on your mobile phone or tablet that can help you stick to your quit plan. There are many free apps, such as QuitGuide from the CDC (Centers for Disease Control and Prevention). You can find more support from smokefree.gov and other websites.  This information is not intended to replace advice given to you by your health care provider. Make sure you discuss any questions you have with your health care provider. Document Released: 03/08/2009 Document   Revised: 01/08/2016 Document Reviewed: 09/26/2014 Elsevier Interactive Patient Education  2018 Elsevier Inc.  

## 2016-11-13 NOTE — Telephone Encounter (Signed)
Mailed lab slip, will call with updated BP, wants 30 day trial chlorthalidone

## 2016-11-13 NOTE — Telephone Encounter (Signed)
I will forward to dr Wyline MoodBranch for recommendation

## 2016-11-13 NOTE — Telephone Encounter (Signed)
Per phone call from pt--states since taking her amLODipine (NORVASC) 10 MG tablet [[id number]][[id number]]  She's started to have hair loss --would like to speak w/ someone [phone]

## 2016-12-10 ENCOUNTER — Telehealth: Payer: Self-pay | Admitting: Orthopaedic Surgery

## 2016-12-10 MED ORDER — HYDROCODONE-ACETAMINOPHEN 7.5-325 MG PO TABS: 1.0000 | ORAL_TABLET | Freq: Four times a day (QID) | ORAL | 0 refills | Status: DC | PRN

## 2017-01-07 ENCOUNTER — Telehealth: Payer: Self-pay | Admitting: Orthopaedic Surgery

## 2017-01-07 MED ORDER — HYDROCODONE-ACETAMINOPHEN 7.5-325 MG PO TABS: 1.0000 | ORAL_TABLET | Freq: Four times a day (QID) | ORAL | 0 refills | Status: DC | PRN

## 2017-02-12 ENCOUNTER — Ambulatory Visit (INDEPENDENT_AMBULATORY_CARE_PROVIDER_SITE_OTHER): Payer: Self-pay | Admitting: Orthopaedic Surgery

## 2017-02-12 ENCOUNTER — Encounter: Payer: Self-pay | Admitting: Orthopaedic Surgery

## 2017-02-12 VITALS — BP 186/94 | HR 78 | Temp 97.5°F | Ht 67.0 in | Wt 305.0 lb

## 2017-02-12 DIAGNOSIS — M545 Low back pain: Secondary | ICD-10-CM

## 2017-02-12 DIAGNOSIS — F17210 Nicotine dependence, cigarettes, uncomplicated: Secondary | ICD-10-CM

## 2017-02-12 DIAGNOSIS — G8929 Other chronic pain: Secondary | ICD-10-CM

## 2017-02-12 MED ORDER — HYDROCODONE-ACETAMINOPHEN 7.5-325 MG PO TABS: 1.0000 | ORAL_TABLET | Freq: Four times a day (QID) | ORAL | 0 refills | Status: DC | PRN

## 2017-02-12 MED ORDER — NAPROXEN 500 MG PO TABS: 500.0000 mg | ORAL_TABLET | Freq: Two times a day (BID) | ORAL | 5 refills | Status: DC

## 2017-02-12 NOTE — Addendum Note (Signed)
Addended by: Earnstine Regal on: 02/12/2017 02:40 PM   Modules accepted: Orders

## 2017-02-12 NOTE — Progress Notes (Signed)
Patient Wendy Ramirez, female DOB:Sep 22, 1965, 51 y.o. [id number]:[id number]  Chief Complaint  Patient presents with  . Follow-up    Low back pain    HPI  SIMMONE Ramirez is a 51 y.o. female who has chronic lower back pain. She is stable.  She has no new trauma, no paresthesias.  She has no weakness.  She is taking her medicine. HPI  Body mass index is 47.77 kg/m.  ROS  Review of Systems  HENT: Negative for congestion.   Respiratory: Positive for shortness of breath. Negative for cough.   Musculoskeletal: Positive for arthralgias, back pain and myalgias.  All other systems reviewed and are negative.   Past Medical History:  Diagnosis Date  . Atrial fibrillation (HCC)   . Back pain   . Diverticulitis   . Pneumonia   . SVT (supraventricular tachycardia) (HCC)     Past Surgical History:  Procedure Laterality Date  . CHOLECYSTECTOMY    . TUBAL LIGATION      Family History  Problem Relation Age of Onset  . Heart disease Father   . Atrial fibrillation Maternal Grandmother   . Heart attack Paternal Grandfather     Social History Social History  Substance Use Topics  . Smoking status: Current Every Day Smoker    Packs/day: 1.00    Types: Cigarettes    Start date: 06/19/1993  . Smokeless tobacco: Never Used  . Alcohol use No    No Known Allergies  Current Outpatient Prescriptions  Medication Sig Dispense Refill  . chlorthalidone (HYGROTON) 25 MG tablet Take 1 tablet (25 mg total) by mouth daily. 30 tablet 3  . HYDROcodone-acetaminophen (NORCO) 7.5-325 MG tablet Take 1 tablet by mouth every 6 (six) hours as needed for moderate pain (Must last 30 days.Do not drive or operate machinery while taking this medicine.). 50 tablet 0  . metoprolol tartrate (LOPRESSOR) 100 MG tablet Take 100 mg by mouth 2 (two) times daily.     No current facility-administered medications for this visit.      Physical Exam  Blood pressure (!) 186/94, pulse 78, temperature (!) 97.5 F  (36.4 C), height  (1.702 m), weight (!) 305 lb (138.3 kg).  Constitutional: overall normal hygiene, normal nutrition, well developed, normal grooming, normal body habitus. Assistive device:none  Musculoskeletal: gait and station Limp none, muscle tone and strength are normal, no tremors or atrophy is present.  .  Neurological: coordination overall normal.  Deep tendon reflex/nerve stretch intact.  Sensation normal.  Cranial nerves II-XII intact.   Skin:   Normal overall no scars, lesions, ulcers or rashes. No psoriasis.  Psychiatric: Alert and oriented x 3.  Recent memory intact, remote memory unclear.  Normal mood and affect. Well groomed.  Good eye contact.  Cardiovascular: overall no swelling, no varicosities, no edema bilaterally, normal temperatures of the legs and arms, no clubbing, cyanosis and good capillary refill.  Lymphatic: palpation is normal.  All other systems reviewed and are negative   Spine/Pelvis examination:  Inspection:  Overall, sacoiliac joint benign and hips nontender; without crepitus or defects.   Thoracic spine inspection: Alignment normal without kyphosis present   Lumbar spine inspection:  Alignment  with normal lumbar lordosis, without scoliosis apparent.   Thoracic spine palpation:  without tenderness of spinal processes   Lumbar spine palpation: without tenderness of lumbar area; without tightness of lumbar muscles    Range of Motion:   Lumbar flexion, forward flexion is normal without pain or tenderness  Lumbar extension is full without pain or tenderness   Left lateral bend is normal without pain or tenderness   Right lateral bend is normal without pain or tenderness   Straight leg raising is normal  Strength & tone: normal   Stability overall normal stability The patient has been educated about the nature of the problem(s) and counseled on treatment options.  The patient appeared to understand what I have discussed and is in agreement  with it.  Encounter Diagnoses  Name Primary?  . Chronic midline low back pain without sciatica Yes  . Morbid obesity due to excess calories (HCC)   . Cigarette nicotine dependence without complication     PLAN Call if any problems.  Precautions discussed.  Continue current medications.   Return to clinic 3 months   I have reviewed the [facility] Controlled Substance Reporting System web site prior to prescribing narcotic medicine for this patient.  Electronically Signed Darreld Mclean, MD 9/20/20182:39 PM

## 2017-02-12 NOTE — Patient Instructions (Signed)
Steps to Quit Smoking Smoking tobacco can be bad for your health. It can also affect almost every organ in your body. Smoking puts you and people around you at risk for many serious Georgi Tuel-lasting (chronic) diseases. Quitting smoking is hard, but it is one of the best things that you can do for your health. It is never too late to quit. What are the benefits of quitting smoking? When you quit smoking, you lower your risk for getting serious diseases and conditions. They can include:  Lung cancer or lung disease.  Heart disease.  Stroke.  Heart attack.  Not being able to have children (infertility).  Weak bones (osteoporosis) and broken bones (fractures).  If you have coughing, wheezing, and shortness of breath, those symptoms may get better when you quit. You may also get sick less often. If you are pregnant, quitting smoking can help to lower your chances of having a baby of low birth weight. What can I do to help me quit smoking? Talk with your doctor about what can help you quit smoking. Some things you can do (strategies) include:  Quitting smoking totally, instead of slowly cutting back how much you smoke over a period of time.  Going to in-person counseling. You are more likely to quit if you go to many counseling sessions.  Using resources and support systems, such as: ? Online chats with a counselor. ? Phone quitlines. ? Printed self-help materials. ? Support groups or group counseling. ? Text messaging programs. ? Mobile phone apps or applications.  Taking medicines. Some of these medicines may have nicotine in them. If you are pregnant or breastfeeding, do not take any medicines to quit smoking unless your doctor says it is okay. Talk with your doctor about counseling or other things that can help you.  Talk with your doctor about using more than one strategy at the same time, such as taking medicines while you are also going to in-person counseling. This can help make  quitting easier. What things can I do to make it easier to quit? Quitting smoking might feel very hard at first, but there is a lot that you can do to make it easier. Take these steps:  Talk to your family and friends. Ask them to support and encourage you.  Call phone quitlines, reach out to support groups, or work with a counselor.  Ask people who smoke to not smoke around you.  Avoid places that make you want (trigger) to smoke, such as: ? Bars. ? Parties. ? Smoke-break areas at work.  Spend time with people who do not smoke.  Lower the stress in your life. Stress can make you want to smoke. Try these things to help your stress: ? Getting regular exercise. ? Deep-breathing exercises. ? Yoga. ? Meditating. ? Doing a body scan. To do this, close your eyes, focus on one area of your body at a time from head to toe, and notice which parts of your body are tense. Try to relax the muscles in those areas.  Download or buy apps on your mobile phone or tablet that can help you stick to your quit plan. There are many free apps, such as QuitGuide from the CDC (Centers for Disease Control and Prevention). You can find more support from smokefree.gov and other websites.  This information is not intended to replace advice given to you by your health care provider. Make sure you discuss any questions you have with your health care provider. Document Released: 03/08/2009 Document   Revised: 01/08/2016 Document Reviewed: 09/26/2014 Elsevier Interactive Patient Education  2018 Elsevier Inc.  

## 2017-03-10 ENCOUNTER — Telehealth: Payer: Self-pay | Admitting: Orthopaedic Surgery

## 2017-03-11 MED ORDER — HYDROCODONE-ACETAMINOPHEN 7.5-325 MG PO TABS: 1.0000 | ORAL_TABLET | Freq: Four times a day (QID) | ORAL | 0 refills | Status: DC | PRN

## 2017-04-08 ENCOUNTER — Other Ambulatory Visit: Payer: Self-pay | Admitting: Orthopaedic Surgery

## 2017-04-08 MED ORDER — HYDROCODONE-ACETAMINOPHEN 7.5-325 MG PO TABS: 1.0000 | ORAL_TABLET | Freq: Four times a day (QID) | ORAL | 0 refills | Status: DC | PRN

## 2017-04-08 NOTE — Telephone Encounter (Signed)
Routing to Dr. Keeling 

## 2017-05-14 ENCOUNTER — Ambulatory Visit: Payer: Self-pay | Admitting: Orthopaedic Surgery

## 2017-05-14 ENCOUNTER — Encounter: Payer: Self-pay | Admitting: Orthopaedic Surgery

## 2017-05-14 VITALS — BP 158/90 | HR 85 | Ht 67.0 in | Wt 300.0 lb

## 2017-05-14 DIAGNOSIS — F17210 Nicotine dependence, cigarettes, uncomplicated: Secondary | ICD-10-CM

## 2017-05-14 DIAGNOSIS — G8929 Other chronic pain: Secondary | ICD-10-CM

## 2017-05-14 DIAGNOSIS — M545 Low back pain: Secondary | ICD-10-CM

## 2017-05-14 MED ORDER — HYDROCODONE-ACETAMINOPHEN 7.5-325 MG PO TABS: 1.0000 | ORAL_TABLET | Freq: Four times a day (QID) | ORAL | 0 refills | Status: DC | PRN

## 2017-05-14 NOTE — Patient Instructions (Signed)
Coping with Quitting Smoking Quitting smoking is a physical and mental challenge. You will face cravings, withdrawal symptoms, and temptation. Before quitting, work with your health care provider to make a plan that can help you cope. Preparation can help you quit and keep you from giving in. How can I cope with cravings? Cravings usually last for 5-10 minutes. If you get through it, the craving will pass. Consider taking the following actions to help you cope with cravings:  Keep your mouth busy: ? Chew sugar-free gum. ? Suck on hard candies or a straw. ? Brush your teeth.  Keep your hands and body busy: ? Immediately change to a different activity when you feel a craving. ? Squeeze or play with a ball. ? Do an activity or a hobby, like making bead jewelry, practicing needlepoint, or working with wood. ? Mix up your normal routine. ? Take a short exercise break. Go for a quick walk or run up and down stairs. ? Spend time in public places where smoking is not allowed.  Focus on doing something kind or helpful for someone else.  Call a friend or family member to talk during a craving.  Join a support group.  Call a quit line, such as 1-800-QUIT-NOW.  Talk with your health care provider about medicines that might help you cope with cravings and make quitting easier for you.  How can I deal with withdrawal symptoms? Your body may experience negative effects as it tries to get used to not having nicotine in the system. These effects are called withdrawal symptoms. They may include:  Feeling hungrier than normal.  Trouble concentrating.  Irritability.  Trouble sleeping.  Feeling depressed.  Restlessness and agitation.  Craving a cigarette.  To manage withdrawal symptoms:  Avoid places, people, and activities that trigger your cravings.  Remember why you want to quit.  Get plenty of sleep.  Avoid coffee and other caffeinated drinks. These may worsen some of your  symptoms.  How can I handle social situations? Social situations can be difficult when you are quitting smoking, especially in the first few weeks. To manage this, you can:  Avoid parties, bars, and other social situations where people might be smoking.  Avoid alcohol.  Leave right away if you have the urge to smoke.  Explain to your family and friends that you are quitting smoking. Ask for understanding and support.  Plan activities with friends or family where smoking is not an option.  What are some ways I can cope with stress? Wanting to smoke may cause stress, and stress can make you want to smoke. Find ways to manage your stress. Relaxation techniques can help. For example:  Breathe slowly and deeply, in through your nose and out through your mouth.  Listen to soothing, relaxing music.  Talk with a family member or friend about your stress.  Light a candle.  Soak in a bath or take a shower.  Think about a peaceful place.  What are some ways I can prevent weight gain? Be aware that many people gain weight after they quit smoking. However, not everyone does. To keep from gaining weight, have a plan in place before you quit and stick to the plan after you quit. Your plan should include:  Having healthy snacks. When you have a craving, it may help to: ? Eat plain popcorn, crunchy carrots, celery, or other cut vegetables. ? Chew sugar-free gum.  Changing how you eat: ? Eat small portion sizes at meals. ?   Eat 4-6 small meals throughout the day instead of 1-2 large meals a day. ? Be mindful when you eat. Do not watch television or do other things that might distract you as you eat.  Exercising regularly: ? Make time to exercise each day. If you do not have time for a long workout, do short bouts of exercise for 5-10 minutes several times a day. ? Do some form of strengthening exercise, like weight lifting, and some form of aerobic exercise, like running or  swimming.  Drinking plenty of water or other low-calorie or no-calorie drinks. Drink 6-8 glasses of water daily, or as much as instructed by your health care provider.  Summary  Quitting smoking is a physical and mental challenge. You will face cravings, withdrawal symptoms, and temptation to smoke again. Preparation can help you as you go through these challenges.  You can cope with cravings by keeping your mouth busy (such as by chewing gum), keeping your body and hands busy, and making calls to family, friends, or a helpline for people who want to quit smoking.  You can cope with withdrawal symptoms by avoiding places where people smoke, avoiding drinks with caffeine, and getting plenty of rest.  Ask your health care provider about the different ways to prevent weight gain, avoid stress, and handle social situations. This information is not intended to replace advice given to you by your health care provider. Make sure you discuss any questions you have with your health care provider. Document Released: 05/09/2016 Document Revised: 05/09/2016 Document Reviewed: 05/09/2016 Elsevier Interactive Patient Education  2018 Elsevier Inc.  

## 2017-05-14 NOTE — Progress Notes (Signed)
Patient Wendy Ramirez, female DOB:30-Jul-1965, 51 y.o. [id number]  Chief Complaint  Patient presents with  . Follow-up    Low Back Pain    HPI  Wendy Ramirez is a 51 y.o. female who has chronic lower back pain. She has had some increased pain secondary to the weather but no new trauma, no weakness.  She is doing her exercises and taking her medicine. HPI  Body mass index is 46.99 kg/m.  ROS  Review of Systems  HENT: Negative for congestion.   Respiratory: Positive for shortness of breath. Negative for cough.   Musculoskeletal: Positive for arthralgias, back pain and myalgias.  All other systems reviewed and are negative.   Past Medical History:  Diagnosis Date  . Atrial fibrillation (HCC)   . Back pain   . Diverticulitis   . Pneumonia   . SVT (supraventricular tachycardia) (HCC)     Past Surgical History:  Procedure Laterality Date  . CHOLECYSTECTOMY    . TUBAL LIGATION      Family History  Problem Relation Age of Onset  . Heart disease Father   . Atrial fibrillation Maternal Grandmother   . Heart attack Paternal Grandfather     Social History Social History   Tobacco Use  . Smoking status: Current Every Day Smoker    Packs/day: 1.00    Types: Cigarettes    Start date: 06/19/1993  . Smokeless tobacco: Never Used  Substance Use Topics  . Alcohol use: No  . Drug use: No    No Known Allergies  Current Outpatient Medications  Medication Sig Dispense Refill  . chlorthalidone (HYGROTON) 25 MG tablet Take 1 tablet (25 mg total) by mouth daily. 30 tablet 3  . HYDROcodone-acetaminophen (NORCO) 7.5-325 MG tablet Take 1 tablet by mouth every 6 (six) hours as needed for moderate pain (Must last 30 days.Do not drive or operate machinery while taking this medicine.). 50 tablet 0  . metoprolol tartrate (LOPRESSOR) 100 MG tablet Take 100 mg by mouth 2 (two) times daily.    . naproxen (NAPROSYN) 500 MG tablet Take 1 tablet (500 mg total) by mouth 2 (two)  times daily with a meal. 60 tablet 5   No current facility-administered medications for this visit.      Physical Exam  Blood pressure (!) 158/90, pulse 85, height  (1.702 m), weight 300 lb (136.1 kg).  Constitutional: overall normal hygiene, normal nutrition, well developed, normal grooming, normal body habitus. Assistive device:none  Musculoskeletal: gait and station Limp none, muscle tone and strength are normal, no tremors or atrophy is present.  .  Neurological: coordination overall normal.  Deep tendon reflex/nerve stretch intact.  Sensation normal.  Cranial nerves II-XII intact.   Skin:   Normal overall no scars, lesions, ulcers or rashes. No psoriasis.  Psychiatric: Alert and oriented x 3.  Recent memory intact, remote memory unclear.  Normal mood and affect. Well groomed.  Good eye contact.  Cardiovascular: overall no swelling, no varicosities, no edema bilaterally, normal temperatures of the legs and arms, no clubbing, cyanosis and good capillary refill.  Lymphatic: palpation is normal.  All other systems reviewed and are negative   Spine/Pelvis examination:  Inspection:  Overall, sacoiliac joint benign and hips nontender; without crepitus or defects.   Thoracic spine inspection: Alignment normal without kyphosis present   Lumbar spine inspection:  Alignment  with normal lumbar lordosis, without scoliosis apparent.   Thoracic spine palpation:  without tenderness of spinal processes   Lumbar spine palpation:  without tenderness of lumbar area; without tightness of lumbar muscles    Range of Motion:   Lumbar flexion, forward flexion is normal without pain or tenderness    Lumbar extension is full without pain or tenderness   Left lateral bend is normal without pain or tenderness   Right lateral bend is normal without pain or tenderness   Straight leg raising is normal  Strength & tone: normal   Stability overall normal stability The patient has been educated  about the nature of the problem(s) and counseled on treatment options.  The patient appeared to understand what I have discussed and is in agreement with it.  Encounter Diagnoses  Name Primary?  . Chronic midline low back pain without sciatica Yes  . Cigarette nicotine dependence without complication     PLAN Call if any problems.  Precautions discussed.  Continue current medications.   Return to clinic 2 months   I have reviewed the [facility] [place] [facility] [place] web site prior to prescribing narcotic medicine for this patient.  Electronically Signed Darreld McleanWayne Axten Pascucci, MD 12/20/20182:06 PM

## 2017-06-10 ENCOUNTER — Other Ambulatory Visit: Payer: Self-pay | Admitting: Orthopaedic Surgery

## 2017-06-10 MED ORDER — HYDROCODONE-ACETAMINOPHEN 7.5-325 MG PO TABS: 1.0000 | ORAL_TABLET | Freq: Four times a day (QID) | ORAL | 0 refills | Status: DC | PRN

## 2017-07-07 ENCOUNTER — Other Ambulatory Visit: Payer: Self-pay | Admitting: Orthopaedic Surgery

## 2017-07-08 MED ORDER — HYDROCODONE-ACETAMINOPHEN 7.5-325 MG PO TABS: 1.0000 | ORAL_TABLET | Freq: Four times a day (QID) | ORAL | 0 refills | Status: DC | PRN

## 2017-07-16 ENCOUNTER — Encounter: Payer: Self-pay | Admitting: Orthopaedic Surgery

## 2017-07-16 ENCOUNTER — Ambulatory Visit: Payer: Self-pay | Admitting: Orthopaedic Surgery

## 2017-07-16 VITALS — BP 158/79 | HR 76 | Ht 67.0 in | Wt 298.0 lb

## 2017-07-16 DIAGNOSIS — M545 Low back pain, unspecified: Secondary | ICD-10-CM

## 2017-07-16 DIAGNOSIS — F17210 Nicotine dependence, cigarettes, uncomplicated: Secondary | ICD-10-CM

## 2017-07-16 DIAGNOSIS — G8929 Other chronic pain: Secondary | ICD-10-CM

## 2017-07-16 NOTE — Progress Notes (Signed)
Patient WU:[id number] Wendy Ramirez, female DOB:22-May-1966, 52 y.o. [id number]  Chief Complaint  Patient presents with  . Back Pain    HPI  Wendy Ramirez is a 52 y.o. female who has chronic lower back pain with good and bad days.  The cold rainy days we have been having have made her pain in the back worse.  She has no paresthesias.  She is taking her medicine and doing her exercises.  She has no new trauma. HPI  Body mass index is 46.67 kg/m.  ROS  Review of Systems  HENT: Negative for congestion.   Respiratory: Positive for shortness of breath. Negative for cough.   Musculoskeletal: Positive for arthralgias, back pain and myalgias.  All other systems reviewed and are negative.   Past Medical History:  Diagnosis Date  . Atrial fibrillation (HCC)   . Back pain   . Diverticulitis   . Pneumonia   . SVT (supraventricular tachycardia) (HCC)     Past Surgical History:  Procedure Laterality Date  . CHOLECYSTECTOMY    . TUBAL LIGATION      Family History  Problem Relation Age of Onset  . Heart disease Father   . Atrial fibrillation Maternal Grandmother   . Heart attack Paternal Grandfather     Social History Social History   Tobacco Use  . Smoking status: Current Every Day Smoker    Packs/day: 1.00    Types: Cigarettes    Start date: 06/19/1993  . Smokeless tobacco: Never Used  Substance Use Topics  . Alcohol use: No  . Drug use: No    No Known Allergies  Current Outpatient Medications  Medication Sig Dispense Refill  . chlorthalidone (HYGROTON) 25 MG tablet Take 1 tablet (25 mg total) by mouth daily. 30 tablet 3  . HYDROcodone-acetaminophen (NORCO) 7.5-325 MG tablet Take 1 tablet by mouth every 6 (six) hours as needed for moderate pain (Must last 30 days). 50 tablet 0  . metoprolol tartrate (LOPRESSOR) 100 MG tablet Take 100 mg by mouth 2 (two) times daily.    . naproxen (NAPROSYN) 500 MG tablet Take 1 tablet (500 mg total) by mouth 2 (two) times daily with a  meal. 60 tablet 5   No current facility-administered medications for this visit.      Physical Exam  Blood pressure (!) 158/79, pulse 76, height 5\' 7"  (1.702 m), weight 298 lb (135.2 kg).  Constitutional: overall normal hygiene, normal nutrition, well developed, normal grooming, normal body habitus. Assistive device:none  Musculoskeletal: gait and station Limp none, muscle tone and strength are normal, no tremors or atrophy is present.  .  Neurological: coordination overall normal.  Deep tendon reflex/nerve stretch intact.  Sensation normal.  Cranial nerves II-XII intact.   Skin:   Normal overall no scars, lesions, ulcers or rashes. No psoriasis.  Psychiatric: Alert and oriented x 3.  Recent memory intact, remote memory unclear.  Normal mood and affect. Well groomed.  Good eye contact.  Cardiovascular: overall no swelling, no varicosities, no edema bilaterally, normal temperatures of the legs and arms, no clubbing, cyanosis and good capillary refill.  Lymphatic: palpation is normal.  Spine/Pelvis examination:  Inspection:  Overall, sacoiliac joint benign and hips nontender; without crepitus or defects.   Thoracic spine inspection: Alignment normal without kyphosis present   Lumbar spine inspection:  Alignment  with normal lumbar lordosis, without scoliosis apparent.   Thoracic spine palpation:  without tenderness of spinal processes   Lumbar spine palpation: without tenderness of lumbar area; without  tightness of lumbar muscles    Range of Motion:   Lumbar flexion, forward flexion is normal without pain or tenderness    Lumbar extension is full without pain or tenderness   Left lateral bend is normal without pain or tenderness   Right lateral bend is normal without pain or tenderness   Straight leg raising is normal  Strength & tone: normal   Stability overall normal stability  All other systems reviewed and are negative   The patient has been educated about the nature  of the problem(s) and counseled on treatment options.  The patient appeared to understand what I have discussed and is in agreement with it.  Encounter Diagnoses  Name Primary?  . Chronic midline low back pain without sciatica Yes  . Cigarette nicotine dependence without complication   . Morbid obesity due to excess calories [facility](HCC)     PLAN Call if any problems.  Precautions discussed.  Continue current medications.   Return to clinic 3 months   Electronically Signed Darreld McleanWayne Mirra Basilio, MD 2/21/20192:29 PM

## 2017-07-16 NOTE — Patient Instructions (Signed)
Steps to Quit Smoking Smoking tobacco can be bad for your health. It can also affect almost every organ in your body. Smoking puts you and people around you at risk for many serious long-lasting (chronic) diseases. Quitting smoking is hard, but it is one of the best things that you can do for your health. It is never too late to quit. What are the benefits of quitting smoking? When you quit smoking, you lower your risk for getting serious diseases and conditions. They can include:  Lung cancer or lung disease.  Heart disease.  Stroke.  Heart attack.  Not being able to have children (infertility).  Weak bones (osteoporosis) and broken bones (fractures).  If you have coughing, wheezing, and shortness of breath, those symptoms may get better when you quit. You may also get sick less often. If you are pregnant, quitting smoking can help to lower your chances of having a baby of low birth weight. What can I do to help me quit smoking? Talk with your doctor about what can help you quit smoking. Some things you can do (strategies) include:  Quitting smoking totally, instead of slowly cutting back how much you smoke over a period of time.  Going to in-person counseling. You are more likely to quit if you go to many counseling sessions.  Using resources and support systems, such as: ? Online chats with a counselor. ? Phone quitlines. ? Printed self-help materials. ? Support groups or group counseling. ? Text messaging programs. ? Mobile phone apps or applications.  Taking medicines. Some of these medicines may have nicotine in them. If you are pregnant or breastfeeding, do not take any medicines to quit smoking unless your doctor says it is okay. Talk with your doctor about counseling or other things that can help you.  Talk with your doctor about using more than one strategy at the same time, such as taking medicines while you are also going to in-person counseling. This can help make  quitting easier. What things can I do to make it easier to quit? Quitting smoking might feel very hard at first, but there is a lot that you can do to make it easier. Take these steps:  Talk to your family and friends. Ask them to support and encourage you.  Call phone quitlines, reach out to support groups, or work with a counselor.  Ask people who smoke to not smoke around you.  Avoid places that make you want (trigger) to smoke, such as: ? Bars. ? Parties. ? Smoke-break areas at work.  Spend time with people who do not smoke.  Lower the stress in your life. Stress can make you want to smoke. Try these things to help your stress: ? Getting regular exercise. ? Deep-breathing exercises. ? Yoga. ? Meditating. ? Doing a body scan. To do this, close your eyes, focus on one area of your body at a time from head to toe, and notice which parts of your body are tense. Try to relax the muscles in those areas.  Download or buy apps on your mobile phone or tablet that can help you stick to your quit plan. There are many free apps, such as QuitGuide from the CDC (Centers for Disease Control and Prevention). You can find more support from smokefree.gov and other websites.  This information is not intended to replace advice given to you by your health care provider. Make sure you discuss any questions you have with your health care provider. Document Released: 03/08/2009 Document   Revised: 01/08/2016 Document Reviewed: 09/26/2014 Elsevier Interactive Patient Education  2018 [employer] Inc.  

## 2017-08-03 ENCOUNTER — Other Ambulatory Visit: Payer: Self-pay | Admitting: Orthopaedic Surgery

## 2017-08-04 MED ORDER — HYDROCODONE-ACETAMINOPHEN 7.5-325 MG PO TABS: 1.0000 | ORAL_TABLET | Freq: Four times a day (QID) | ORAL | 0 refills | Status: DC | PRN

## 2017-09-01 ENCOUNTER — Other Ambulatory Visit: Payer: Self-pay | Admitting: Orthopedic Surgery

## 2017-09-02 MED ORDER — HYDROCODONE-ACETAMINOPHEN 7.5-325 MG PO TABS: 1.0000 | ORAL_TABLET | Freq: Four times a day (QID) | ORAL | 0 refills | Status: DC | PRN

## 2017-09-08 ENCOUNTER — Other Ambulatory Visit: Payer: Self-pay

## 2017-09-08 MED ORDER — METOPROLOL TARTRATE 100 MG PO TABS: 100.0000 mg | ORAL_TABLET | Freq: Two times a day (BID) | ORAL | 3 refills | Status: DC

## 2017-09-29 ENCOUNTER — Other Ambulatory Visit: Payer: Self-pay | Admitting: Orthopedic Surgery

## 2017-10-01 MED ORDER — HYDROCODONE-ACETAMINOPHEN 7.5-325 MG PO TABS: 1.0000 | ORAL_TABLET | Freq: Four times a day (QID) | ORAL | 0 refills | Status: DC | PRN

## 2017-10-07 ENCOUNTER — Ambulatory Visit: Payer: Self-pay | Admitting: *Deleted

## 2017-10-13 ENCOUNTER — Encounter: Payer: Self-pay | Admitting: Orthopaedic Surgery

## 2017-10-13 ENCOUNTER — Ambulatory Visit: Payer: Self-pay | Admitting: Orthopaedic Surgery

## 2017-10-13 VITALS — BP 140/76 | HR 71 | Ht 67.0 in | Wt 286.0 lb

## 2017-10-13 DIAGNOSIS — G8929 Other chronic pain: Secondary | ICD-10-CM

## 2017-10-13 DIAGNOSIS — F17210 Nicotine dependence, cigarettes, uncomplicated: Secondary | ICD-10-CM

## 2017-10-13 DIAGNOSIS — M545 Low back pain: Secondary | ICD-10-CM

## 2017-10-13 MED ORDER — HYDROCODONE-ACETAMINOPHEN 7.5-325 MG PO TABS: 1.0000 | ORAL_TABLET | Freq: Four times a day (QID) | ORAL | 0 refills | Status: DC | PRN

## 2017-10-13 NOTE — Patient Instructions (Signed)
Steps to Quit Smoking Smoking tobacco can be bad for your health. It can also affect almost every organ in your body. Smoking puts you and people around you at risk for many serious Wendy Ramirez-lasting (chronic) diseases. Quitting smoking is hard, but it is one of the best things that you can do for your health. It is never too late to quit. What are the benefits of quitting smoking? When you quit smoking, you lower your risk for getting serious diseases and conditions. They can include:  Lung cancer or lung disease.  Heart disease.  Stroke.  Heart attack.  Not being able to have children (infertility).  Weak bones (osteoporosis) and broken bones (fractures).  If you have coughing, wheezing, and shortness of breath, those symptoms may get better when you quit. You may also get sick less often. If you are pregnant, quitting smoking can help to lower your chances of having a baby of low birth weight. What can I do to help me quit smoking? Talk with your doctor about what can help you quit smoking. Some things you can do (strategies) include:  Quitting smoking totally, instead of slowly cutting back how much you smoke over a period of time.  Going to in-person counseling. You are more likely to quit if you go to many counseling sessions.  Using resources and support systems, such as: ? Online chats with a counselor. ? Phone quitlines. ? Printed self-help materials. ? Support groups or group counseling. ? Text messaging programs. ? Mobile phone apps or applications.  Taking medicines. Some of these medicines may have nicotine in them. If you are pregnant or breastfeeding, do not take any medicines to quit smoking unless your doctor says it is okay. Talk with your doctor about counseling or other things that can help you.  Talk with your doctor about using more than one strategy at the same time, such as taking medicines while you are also going to in-person counseling. This can help make  quitting easier. What things can I do to make it easier to quit? Quitting smoking might feel very hard at first, but there is a lot that you can do to make it easier. Take these steps:  Talk to your family and friends. Ask them to support and encourage you.  Call phone quitlines, reach out to support groups, or work with a counselor.  Ask people who smoke to not smoke around you.  Avoid places that make you want (trigger) to smoke, such as: ? Bars. ? Parties. ? Smoke-break areas at work.  Spend time with people who do not smoke.  Lower the stress in your life. Stress can make you want to smoke. Try these things to help your stress: ? Getting regular exercise. ? Deep-breathing exercises. ? Yoga. ? Meditating. ? Doing a body scan. To do this, close your eyes, focus on one area of your body at a time from head to toe, and notice which parts of your body are tense. Try to relax the muscles in those areas.  Download or buy apps on your mobile phone or tablet that can help you stick to your quit plan. There are many free apps, such as QuitGuide from the CDC (Centers for Disease Control and Prevention). You can find more support from smokefree.gov and other websites.  This information is not intended to replace advice given to you by your health care provider. Make sure you discuss any questions you have with your health care provider. Document Released: 03/08/2009 Document   Revised: 01/08/2016 Document Reviewed: 09/26/2014 Elsevier Interactive Patient Education  2018 Elsevier Inc.  

## 2017-10-13 NOTE — Progress Notes (Signed)
Patient Wendy Ramirez, female DOB:1965/06/19, 52 y.o. [id number]  Chief Complaint  Patient presents with  . Follow-up    chronic pain    HPI  Wendy Ramirez is a 52 y.o. female who has chronic lower back pain.  She is stable right now.  She has no weakness or acute injury.   She is active and doing her exercises. HPI  Body mass index is 44.79 kg/m.  ROS  Review of Systems  HENT: Negative for congestion.   Respiratory: Positive for shortness of breath. Negative for cough.   Musculoskeletal: Positive for arthralgias, back pain and myalgias.  All other systems reviewed and are negative.   Past Medical History:  Diagnosis Date  . Atrial fibrillation (HCC)   . Back pain   . Diverticulitis   . Pneumonia   . SVT (supraventricular tachycardia) (HCC)     Past Surgical History:  Procedure Laterality Date  . CHOLECYSTECTOMY    . TUBAL LIGATION      Family History  Problem Relation Age of Onset  . Heart disease Father   . Atrial fibrillation Maternal Grandmother   . Heart attack Paternal Grandfather     Social History Social History   Tobacco Use  . Smoking status: Current Every Day Smoker    Packs/day: 1.00    Types: Cigarettes    Start date: 06/19/1993  . Smokeless tobacco: Never Used  Substance Use Topics  . Alcohol use: No  . Drug use: No    No Known Allergies  Current Outpatient Medications  Medication Sig Dispense Refill  . chlorthalidone (HYGROTON) 25 MG tablet Take 1 tablet (25 mg total) by mouth daily. 30 tablet 3  . HYDROcodone-acetaminophen (NORCO) 7.5-325 MG tablet Take 1 tablet by mouth every 6 (six) hours as needed for moderate pain (Must last 30 days). 50 tablet 0  . metoprolol tartrate (LOPRESSOR) 100 MG tablet Take 1 tablet (100 mg total) by mouth 2 (two) times daily. 180 tablet 3  . naproxen (NAPROSYN) 500 MG tablet Take 1 tablet (500 mg total) by mouth 2 (two) times daily with a meal. 60 tablet 5   No current facility-administered  medications for this visit.      Physical Exam  Blood pressure 140/76, pulse 71, height  (1.702 m), weight 286 lb (129.7 kg).  Constitutional: overall normal hygiene, normal nutrition, well developed, normal grooming, normal body habitus. Assistive device:none  Musculoskeletal: gait and station Limp none, muscle tone and strength are normal, no tremors or atrophy is present.  .  Neurological: coordination overall normal.  Deep tendon reflex/nerve stretch intact.  Sensation normal.  Cranial nerves II-XII intact.   Skin:   Normal overall no scars, lesions, ulcers or rashes. No psoriasis.  Psychiatric: Alert and oriented x 3.  Recent memory intact, remote memory unclear.  Normal mood and affect. Well groomed.  Good eye contact.  Cardiovascular: overall no swelling, no varicosities, no edema bilaterally, normal temperatures of the legs and arms, no clubbing, cyanosis and good capillary refill.  Lymphatic: palpation is normal.  Spine/Pelvis examination:  Inspection:  Overall, sacoiliac joint benign and hips nontender; without crepitus or defects.   Thoracic spine inspection: Alignment normal without kyphosis present   Lumbar spine inspection:  Alignment  with normal lumbar lordosis, without scoliosis apparent.   Thoracic spine palpation:  without tenderness of spinal processes   Lumbar spine palpation: without tenderness of lumbar area; without tightness of lumbar muscles    Range of Motion:   Lumbar  flexion, forward flexion is normal without pain or tenderness    Lumbar extension is full without pain or tenderness   Left lateral bend is normal without pain or tenderness   Right lateral bend is normal without pain or tenderness   Straight leg raising is normal  Strength & tone: normal   Stability overall normal stability All other systems reviewed and are negative   The patient has been educated about the nature of the problem(s) and counseled on treatment options.  The  patient appeared to understand what I have discussed and is in agreement with it.  Encounter Diagnoses  Name Primary?  . Chronic midline low back pain without sciatica Yes  . Cigarette nicotine dependence without complication   . Morbid obesity due to excess calories [facility])     PLAN Call if any problems.  Precautions discussed.  Continue current medications.   Return to clinic 3 months   I have reviewed the [facility] Controlled Substance Reporting System web site prior to prescribing narcotic medicine for this patient.  Electronically Signed Darreld Mclean, MD 5/21/20192:14 PM

## 2017-10-28 ENCOUNTER — Other Ambulatory Visit: Payer: Self-pay | Admitting: Orthopaedic Surgery

## 2017-10-28 MED ORDER — HYDROCODONE-ACETAMINOPHEN 7.5-325 MG PO TABS: 1.0000 | ORAL_TABLET | Freq: Four times a day (QID) | ORAL | 0 refills | Status: DC | PRN

## 2017-11-23 ENCOUNTER — Other Ambulatory Visit: Payer: Self-pay | Admitting: Orthopaedic Surgery

## 2017-11-24 MED ORDER — HYDROCODONE-ACETAMINOPHEN 7.5-325 MG PO TABS: 1.0000 | ORAL_TABLET | Freq: Four times a day (QID) | ORAL | 0 refills | Status: DC | PRN

## 2017-12-22 ENCOUNTER — Other Ambulatory Visit: Payer: Self-pay | Admitting: Orthopaedic Surgery

## 2017-12-23 MED ORDER — HYDROCODONE-ACETAMINOPHEN 7.5-325 MG PO TABS: 1.0000 | ORAL_TABLET | Freq: Four times a day (QID) | ORAL | 0 refills | Status: DC | PRN

## 2018-01-12 ENCOUNTER — Encounter: Payer: Self-pay | Admitting: Orthopaedic Surgery

## 2018-01-12 ENCOUNTER — Ambulatory Visit: Payer: Self-pay | Admitting: Orthopaedic Surgery

## 2018-01-12 VITALS — BP 153/78 | HR 66 | Ht 67.0 in | Wt 271.0 lb

## 2018-01-12 DIAGNOSIS — F17210 Nicotine dependence, cigarettes, uncomplicated: Secondary | ICD-10-CM

## 2018-01-12 DIAGNOSIS — Z6841 Body Mass Index (BMI) 40.0 and over, adult: Secondary | ICD-10-CM

## 2018-01-12 DIAGNOSIS — G8929 Other chronic pain: Secondary | ICD-10-CM

## 2018-01-12 DIAGNOSIS — M545 Low back pain: Secondary | ICD-10-CM

## 2018-01-12 NOTE — Progress Notes (Signed)
[name], female DOB:10/26/1965, 52 y.o. [id number]  Chief Complaint  Patient presents with  . Back Pain    HPI  Wendy Ramirez is a 52 y.o. female who has chronic pain of the back.  She has been doing better.  She has lost 30 pounds and is exercising.  She has no new trauma.   Body mass index is 42.44 kg/m.  The patient meets the AMA guidelines for Morbid (severe) obesity with a BMI > 40.0 and I have recommended weight loss. She has lost 30 pounds in the last several months.  I have encouraged her to develop a better long lasting eating habit. ROS  Review of Systems  HENT: Negative for congestion.   Respiratory: Positive for shortness of breath. Negative for cough.   Musculoskeletal: Positive for arthralgias, back pain and myalgias.  All other systems reviewed and are negative.   All other systems reviewed and are negative.  The following is a summary of the past history medically, past history surgically, known current medicines, social history and family history.  This information is gathered electronically by the computer from prior information and documentation.  I review this each visit and have found including this information at this point in the chart is beneficial and informative.    Past Medical History:  Diagnosis Date  . Atrial fibrillation (HCC)   . Back pain   . Diverticulitis   . Pneumonia   . SVT (supraventricular tachycardia) (HCC)     Past Surgical History:  Procedure Laterality Date  . CHOLECYSTECTOMY    . TUBAL LIGATION      Family History  Problem Relation Age of Onset  . Heart disease Father   . Atrial fibrillation Maternal Grandmother   . Heart attack Paternal Grandfather     Social History Social History   Tobacco Use  . Smoking status: Current Every Day Smoker    Packs/day: 1.00    Types: Cigarettes    Start date: 06/19/1993  . Smokeless tobacco: Never Used  Substance Use Topics  . Alcohol use: No  . Drug  use: No    No Known Allergies  Current Outpatient Medications  Medication Sig Dispense Refill  . HYDROcodone-acetaminophen (NORCO) 7.5-325 MG tablet Take 1 tablet by mouth every 6 (six) hours as needed for moderate pain (Must last 30 days). 50 tablet 0  . metoprolol tartrate (LOPRESSOR) 100 MG tablet Take 1 tablet (100 mg total) by mouth 2 (two) times daily. 180 tablet 3  . naproxen (NAPROSYN) 500 MG tablet Take 1 tablet (500 mg total) by mouth 2 (two) times daily with a meal. 60 tablet 5  . chlorthalidone (HYGROTON) 25 MG tablet Take 1 tablet (25 mg total) by mouth daily. 30 tablet 3   No current facility-administered medications for this visit.      Physical Exam  Blood pressure (!) 153/78, pulse 66, height 5\' 7"  (1.702 m), weight 271 lb (122.9 kg).  Constitutional: overall normal hygiene, normal nutrition, well developed, normal grooming, normal body habitus. Assistive device:none  Musculoskeletal: gait and station Limp none, muscle tone and strength are normal, no tremors or atrophy is present.  .  Neurological: coordination overall normal.  Deep tendon reflex/nerve stretch intact.  Sensation normal.  Cranial nerves II-XII intact.   Skin:   Normal overall no scars, lesions, ulcers or rashes. No psoriasis.  Psychiatric: Alert and oriented x 3.  Recent memory intact, remote memory unclear.  Normal mood and affect. Well groomed.  Good  eye contact.  Cardiovascular: overall no swelling, no varicosities, no edema bilaterally, normal temperatures of the legs and arms, no clubbing, cyanosis and good capillary refill.  Lymphatic: palpation is normal.  Spine/Pelvis examination:  Inspection:  Overall, sacoiliac joint benign and hips nontender; without crepitus or defects.   Thoracic spine inspection: Alignment normal without kyphosis present   Lumbar spine inspection:  Alignment  with normal lumbar lordosis, without scoliosis apparent.   Thoracic spine palpation:  without  tenderness of spinal processes   Lumbar spine palpation: without tenderness of lumbar area; without tightness of lumbar muscles    Range of Motion:   Lumbar flexion, forward flexion is normal without pain or tenderness    Lumbar extension is full without pain or tenderness   Left lateral bend is normal without pain or tenderness   Right lateral bend is normal without pain or tenderness   Straight leg raising is normal  Strength & tone: normal   Stability overall normal stability All other systems reviewed and are negative   The patient has been educated about the nature of the problem(s) and counseled on treatment options.  The patient appeared to understand what I have discussed and is in agreement with it.  Encounter Diagnoses  Name Primary?  . Chronic midline low back pain without sciatica Yes  . Cigarette nicotine dependence without complication   . Body mass index 40.0-44.9, adult (HCC)   . Morbid obesity (HCC)     PLAN Call if any problems.  Precautions discussed.  Continue current medications.   Return to clinic 3 months   Now she needs to cut back on her smoking.  Electronically Signed Darreld McleanWayne Janee Ureste, MD 8/20/20191:53 PM

## 2018-01-12 NOTE — Patient Instructions (Signed)
Steps to Quit Smoking Smoking tobacco can be bad for your health. It can also affect almost every organ in your body. Smoking puts you and people around you at risk for many serious long-lasting (chronic) diseases. Quitting smoking is hard, but it is one of the best things that you can do for your health. It is never too late to quit. What are the benefits of quitting smoking? When you quit smoking, you lower your risk for getting serious diseases and conditions. They can include:  Lung cancer or lung disease.  Heart disease.  Stroke.  Heart attack.  Not being able to have children (infertility).  Weak bones (osteoporosis) and broken bones (fractures).  If you have coughing, wheezing, and shortness of breath, those symptoms may get better when you quit. You may also get sick less often. If you are pregnant, quitting smoking can help to lower your chances of having a baby of low birth weight. What can I do to help me quit smoking? Talk with your doctor about what can help you quit smoking. Some things you can do (strategies) include:  Quitting smoking totally, instead of slowly cutting back how much you smoke over a period of time.  Going to in-person counseling. You are more likely to quit if you go to many counseling sessions.  Using resources and support systems, such as: ? Online chats with a counselor. ? Phone quitlines. ? Printed self-help materials. ? Support groups or group counseling. ? Text messaging programs. ? Mobile phone apps or applications.  Taking medicines. Some of these medicines may have nicotine in them. If you are pregnant or breastfeeding, do not take any medicines to quit smoking unless your doctor says it is okay. Talk with your doctor about counseling or other things that can help you.  Talk with your doctor about using more than one strategy at the same time, such as taking medicines while you are also going to in-person counseling. This can help make  quitting easier. What things can I do to make it easier to quit? Quitting smoking might feel very hard at first, but there is a lot that you can do to make it easier. Take these steps:  Talk to your family and friends. Ask them to support and encourage you.  Call phone quitlines, reach out to support groups, or work with a counselor.  Ask people who smoke to not smoke around you.  Avoid places that make you want (trigger) to smoke, such as: ? Bars. ? Parties. ? Smoke-break areas at work.  Spend time with people who do not smoke.  Lower the stress in your life. Stress can make you want to smoke. Try these things to help your stress: ? Getting regular exercise. ? Deep-breathing exercises. ? Yoga. ? Meditating. ? Doing a body scan. To do this, close your eyes, focus on one area of your body at a time from head to toe, and notice which parts of your body are tense. Try to relax the muscles in those areas.  Download or buy apps on your mobile phone or tablet that can help you stick to your quit plan. There are many free apps, such as QuitGuide from the CDC (Centers for Disease Control and Prevention). You can find more support from smokefree.gov and other websites.  This information is not intended to replace advice given to you by your health care provider. Make sure you discuss any questions you have with your health care provider. Document Released: 03/08/2009 Document   Revised: 01/08/2016 Document Reviewed: 09/26/2014 Elsevier Interactive Patient Education  2018 [employer] Inc.  

## 2018-01-19 ENCOUNTER — Other Ambulatory Visit: Payer: Self-pay

## 2018-01-19 MED ORDER — HYDROCODONE-ACETAMINOPHEN 7.5-325 MG PO TABS: 1.0000 | ORAL_TABLET | Freq: Four times a day (QID) | ORAL | 0 refills | Status: DC | PRN

## 2018-02-16 ENCOUNTER — Other Ambulatory Visit: Payer: Self-pay

## 2018-02-16 MED ORDER — HYDROCODONE-ACETAMINOPHEN 7.5-325 MG PO TABS: 1.0000 | ORAL_TABLET | Freq: Four times a day (QID) | ORAL | 0 refills | Status: DC | PRN

## 2018-03-16 ENCOUNTER — Other Ambulatory Visit: Payer: Self-pay

## 2018-03-17 MED ORDER — HYDROCODONE-ACETAMINOPHEN 7.5-325 MG PO TABS: 1.0000 | ORAL_TABLET | Freq: Four times a day (QID) | ORAL | 0 refills | Status: DC | PRN

## 2018-04-13 ENCOUNTER — Ambulatory Visit: Payer: Self-pay | Admitting: Orthopaedic Surgery

## 2018-04-13 ENCOUNTER — Encounter: Payer: Self-pay | Admitting: Orthopaedic Surgery

## 2018-04-13 VITALS — BP 194/94 | HR 74 | Ht 67.0 in | Wt 274.0 lb

## 2018-04-13 DIAGNOSIS — F17210 Nicotine dependence, cigarettes, uncomplicated: Secondary | ICD-10-CM

## 2018-04-13 DIAGNOSIS — Z6841 Body Mass Index (BMI) 40.0 and over, adult: Secondary | ICD-10-CM

## 2018-04-13 DIAGNOSIS — G8929 Other chronic pain: Secondary | ICD-10-CM

## 2018-04-13 DIAGNOSIS — M545 Low back pain: Secondary | ICD-10-CM

## 2018-04-13 MED ORDER — HYDROCODONE-ACETAMINOPHEN 7.5-325 MG PO TABS: 1.0000 | ORAL_TABLET | Freq: Four times a day (QID) | ORAL | 0 refills | Status: DC | PRN

## 2018-04-13 NOTE — Progress Notes (Signed)
Patient Wendy Ramirez, female DOB:04-Feb-1966, 52 y.o. [id number]:[id number]  Chief Complaint  Patient presents with  . Back Pain    HPI  Wendy Ramirez is a 52 y.o. female who has chronic lower back pain that is worse with the cold rainy weather we have had.  She has no new trauma, no paresthesias.  She is doing her exercises and taking her medicine.   Body mass index is 42.91 kg/m.  The patient meets the AMA guidelines for Morbid (severe) obesity with a BMI > 40.0 and I have recommended weight loss.   ROS  Review of Systems  HENT: Negative for congestion.   Respiratory: Positive for shortness of breath. Negative for cough.   Musculoskeletal: Positive for arthralgias, back pain and myalgias.  All other systems reviewed and are negative.   All other systems reviewed and are negative.  The following is a summary of the past history medically, past history surgically, known current medicines, social history and family history.  This information is gathered electronically by the computer from prior information and documentation.  I review this each visit and have found including this information at this point in the chart is beneficial and informative.    Past Medical History:  Diagnosis Date  . Atrial fibrillation (HCC)   . Back pain   . Diverticulitis   . Pneumonia   . SVT (supraventricular tachycardia) (HCC)     Past Surgical History:  Procedure Laterality Date  . CHOLECYSTECTOMY    . TUBAL LIGATION      Family History  Problem Relation Age of Onset  . Heart disease Father   . Atrial fibrillation Maternal Grandmother   . Heart attack Paternal Grandfather     Social History Social History   Tobacco Use  . Smoking status: Current Every Day Smoker    Packs/day: 1.00    Types: Cigarettes    Start date: 06/19/1993  . Smokeless tobacco: Never Used  Substance Use Topics  . Alcohol use: No  . Drug use: No    No Known Allergies  Current Outpatient Medications   Medication Sig Dispense Refill  . HYDROcodone-acetaminophen (NORCO) 7.5-325 MG tablet Take 1 tablet by mouth every 6 (six) hours as needed for moderate pain (Must last 30 days). 50 tablet 0  . metoprolol tartrate (LOPRESSOR) 100 MG tablet Take 1 tablet (100 mg total) by mouth 2 (two) times daily. 180 tablet 3  . chlorthalidone (HYGROTON) 25 MG tablet Take 1 tablet (25 mg total) by mouth daily. 30 tablet 3  . naproxen (NAPROSYN) 500 MG tablet Take 1 tablet (500 mg total) by mouth 2 (two) times daily with a meal. (Patient not taking: Reported on 04/13/2018) 60 tablet 5   No current facility-administered medications for this visit.      Physical Exam  Blood pressure (!) 194/94, pulse 74, height 5\' 7"  (1.702 m), weight 274 lb (124.3 kg).  Constitutional: overall normal hygiene, normal nutrition, well developed, normal grooming, normal body habitus. Assistive device:none  Musculoskeletal: gait and station Limp none, muscle tone and strength are normal, no tremors or atrophy is present.  .  Neurological: coordination overall normal.  Deep tendon reflex/nerve stretch intact.  Sensation normal.  Cranial nerves II-XII intact.   Skin:   Normal overall no scars, lesions, ulcers or rashes. No psoriasis.  Psychiatric: Alert and oriented x 3.  Recent memory intact, remote memory unclear.  Normal mood and affect. Well groomed.  Good eye contact.  Cardiovascular: overall no swelling, no  varicosities, no edema bilaterally, normal temperatures of the legs and arms, no clubbing, cyanosis and good capillary refill.  Lymphatic: palpation is normal.  Spine/Pelvis examination:  Inspection:  Overall, sacoiliac joint benign and hips nontender; without crepitus or defects.   Thoracic spine inspection: Alignment normal without kyphosis present   Lumbar spine inspection:  Alignment  with normal lumbar lordosis, without scoliosis apparent.   Thoracic spine palpation:  without tenderness of spinal  processes   Lumbar spine palpation: without tenderness of lumbar area; without tightness of lumbar muscles    Range of Motion:   Lumbar flexion, forward flexion is normal without pain or tenderness    Lumbar extension is full without pain or tenderness   Left lateral bend is normal without pain or tenderness   Right lateral bend is normal without pain or tenderness   Straight leg raising is normal  Strength & tone: normal   Stability overall normal stability  All other systems reviewed and are negative   The patient has been educated about the nature of the problem(s) and counseled on treatment options.  The patient appeared to understand what I have discussed and is in agreement with it.  Encounter Diagnoses  Name Primary?  . Chronic midline low back pain without sciatica Yes  . Cigarette nicotine dependence without complication   . Body mass index 40.0-44.9, adult (HCC)   . Morbid obesity (HCC)     PLAN Call if any problems.  Precautions discussed.  Continue current medications.   Return to clinic 3 months   The patient has read and signed an Opioid Treatment Agreement which has been scanned and added to the medical record.  The patient understands the agreement and agrees to abide with it.  The patient has chronic pain that is being treated with an opioid which relieves the pain.  The patient understands potential complications with chronic opioid treatment.   I have reviewed the [place] Cont[place] Substance [place] System web site prior to prescribing narcotic medicine for this patient.   Electronically Signed Darreld McleanWayne Yona Stansbury, MD 11/19/20191:38 PM

## 2018-05-05 ENCOUNTER — Other Ambulatory Visit: Payer: Self-pay

## 2018-05-05 ENCOUNTER — Encounter (HOSPITAL_COMMUNITY): Payer: Self-pay | Admitting: Emergency Medicine

## 2018-05-05 ENCOUNTER — Emergency Department (HOSPITAL_COMMUNITY)
Admission: EM | Admit: 2018-05-05 | Discharge: 2018-05-05 | Disposition: A | Discharge status: Home / Self Care | Payer: Self-pay | Attending: Emergency Medicine | Admitting: Emergency Medicine

## 2018-05-05 DIAGNOSIS — Z9049 Acquired absence of other specified parts of digestive tract: Secondary | ICD-10-CM | POA: Insufficient documentation

## 2018-05-05 DIAGNOSIS — F17210 Nicotine dependence, cigarettes, uncomplicated: Secondary | ICD-10-CM | POA: Insufficient documentation

## 2018-05-05 DIAGNOSIS — N3001 Acute cystitis with hematuria: Secondary | ICD-10-CM

## 2018-05-05 LAB — URINALYSIS, ROUTINE W REFLEX MICROSCOPIC
BILIRUBIN URINE: NEGATIVE
GLUCOSE, UA: NEGATIVE mg/dL
KETONES UR: 5 mg/dL — AB
Leukocytes, UA: NEGATIVE
Nitrite: NEGATIVE
Protein, ur: NEGATIVE mg/dL
Specific Gravity, Urine: 1.027 (ref 1.005–1.030)
pH: 5 (ref 5.0–8.0)

## 2018-05-05 LAB — PREGNANCY, URINE: PREG TEST UR: NEGATIVE

## 2018-05-05 MED ORDER — PHENAZOPYRIDINE HCL 200 MG PO TABS: 200.0000 mg | ORAL_TABLET | Freq: Three times a day (TID) | ORAL | 0 refills | Status: DC

## 2018-05-05 MED ORDER — CEPHALEXIN 500 MG PO CAPS: 500.0000 mg | ORAL_CAPSULE | Freq: Two times a day (BID) | ORAL | 0 refills | Status: AC

## 2018-05-05 NOTE — ED Triage Notes (Signed)
Patient complaining of lower abdominal pain and spasms with urination starting last night.

## 2018-05-05 NOTE — Discharge Instructions (Addendum)
Your symptoms suggest you have an acute cystitis (bladder infection) and have been prescribed antibiotics for this.  You may also take the pyridium which will help with symptom relief (but will turn your urine bright yellow/orange).  Make sure you are drinking plenty of fluids.  Get rechecked for worsening symptoms including development of fever, nausea or vomiting.

## 2018-05-06 NOTE — ED Provider Notes (Signed)
[facility] Provider Note   CSN: [id number] Arrival date & time: 05/05/18  1406     History   Chief Complaint Chief Complaint  Patient presents with  . Dysuria    HPI Wendy Ramirez is a 52 y.o. female with a history as outlined below presenting with dysuria which started yesterday evening.  She describes increased urinary frequency along with burning with urination, suprapubic pressure sensation.  She denies fevers, chills, n/v ,flank pain, vaginal discharge or pain or any changes in her chronic low back pain.  She has found no alleviators for her symptoms.   The history is provided by the patient.    Past Medical History:  Diagnosis Date  . Atrial fibrillation (HCC)   . Back pain   . Diverticulitis   . Pneumonia   . SVT (supraventricular tachycardia) (HCC)     There are no active problems to display for this patient.   Past Surgical History:  Procedure Laterality Date  . CHOLECYSTECTOMY    . TUBAL LIGATION       OB History    Gravida  4   Para  4   Term  4   Preterm      AB      Living        SAB      TAB      Ectopic      Multiple      Live Births               Home Medications    Prior to Admission medications   Medication Sig Start Date End Date Taking? Authorizing Provider  cephALEXin (KEFLEX) 500 MG capsule Take 1 capsule (500 mg total) by mouth 2 (two) times daily for 5 days. 05/05/18 05/10/18  Burgess AmorIdol, Izzak Fries, PA-C  chlorthalidone (HYGROTON) 25 MG tablet Take 1 tablet (25 mg total) by mouth daily. 11/13/16 02/11/17  Antoine PocheBranch, Jonathan F, MD  HYDROcodone-acetaminophen (NORCO) 7.5-325 MG tablet Take 1 tablet by mouth every 6 (six) hours as needed for moderate pain (Must last 30 days). 04/13/18   Darreld McleanKeeling, Wayne, MD  metoprolol tartrate (LOPRESSOR) 100 MG tablet Take 1 tablet (100 mg total) by mouth 2 (two) times daily. 09/08/17   Antoine PocheBranch, Jonathan F, MD  naproxen (NAPROSYN) 500 MG tablet Take 1 tablet (500 mg total) by  mouth 2 (two) times daily with a meal. Patient not taking: Reported on 04/13/2018 02/12/17   Darreld McleanKeeling, Wayne, MD  phenazopyridine (PYRIDIUM) 200 MG tablet Take 1 tablet (200 mg total) by mouth 3 (three) times daily. 05/05/18   Burgess AmorIdol, Lochlyn Zullo, PA-C    Family History Family History  Problem Relation Age of Onset  . Heart disease Father   . Atrial fibrillation Maternal Grandmother   . Heart attack Paternal Grandfather     Social History Social History   Tobacco Use  . Smoking status: Current Every Day Smoker    Packs/day: 1.00    Types: Cigarettes    Start date: 06/19/1993  . Smokeless tobacco: Never Used  Substance Use Topics  . Alcohol use: No  . Drug use: No     Allergies   Patient has no known allergies.   Review of Systems Review of Systems  Constitutional: Negative for chills and fever.  HENT: Negative for congestion and sore throat.   Eyes: Negative.   Respiratory: Negative for chest tightness and shortness of breath.   Cardiovascular: Negative for chest pain.  Gastrointestinal: Negative for abdominal pain, nausea  and vomiting.  Genitourinary: Positive for dysuria, frequency and urgency. Negative for vaginal discharge.  Musculoskeletal: Negative for arthralgias, joint swelling and neck pain.  Skin: Negative.  Negative for rash and wound.  Neurological: Negative for dizziness, weakness, light-headedness, numbness and headaches.  Psychiatric/Behavioral: Negative.      Physical Exam Updated Vital Signs BP 126/72   Pulse 73   Temp 98.4 F (36.9 C) (Oral)   Resp 16   Ht 5\' 7"  (1.702 m)   Wt 122.5 kg   SpO2 96%   BMI 42.29 kg/m   Physical Exam Vitals signs and nursing note reviewed.  Constitutional:      Appearance: She is well-developed.  HENT:     Head: Normocephalic and atraumatic.  Eyes:     Conjunctiva/sclera: Conjunctivae normal.  Neck:     Musculoskeletal: Normal range of motion.  Cardiovascular:     Rate and Rhythm: Normal rate and regular  rhythm.     Heart sounds: Normal heart sounds.  Pulmonary:     Effort: Pulmonary effort is normal.     Breath sounds: Normal breath sounds. No wheezing.  Abdominal:     General: Bowel sounds are normal.     Palpations: Abdomen is soft.     Tenderness: There is no abdominal tenderness. There is no right CVA tenderness or left CVA tenderness.  Musculoskeletal: Normal range of motion.  Skin:    General: Skin is warm and dry.  Neurological:     Mental Status: She is alert.      ED Treatments / Results  Labs (all labs ordered are listed, but only abnormal results are displayed) Labs Reviewed  URINALYSIS, ROUTINE W REFLEX MICROSCOPIC - Abnormal; Notable for the following components:      Result Value   Hgb urine dipstick SMALL (*)    Ketones, ur 5 (*)    Bacteria, UA RARE (*)    All other components within normal limits  URINE CULTURE  PREGNANCY, URINE    EKG None  Radiology No results found.  Procedures Procedures (including critical care time)  Medications Ordered in ED Medications - No data to display   Initial Impression / Assessment and Plan / ED Course  I have reviewed the triage vital signs and the nursing notes.  Pertinent labs & imaging results that were available during my care of the patient were reviewed by me and considered in my medical decision making (see chart for details).  Clinical Course as of May 07 1623  Wed May 05, 2018  1814 Mucus: PRESENT [JI]    Clinical Course User Index [JI] Burgess Amor, PA-C   Pt with dysuria without clear uti.  Will tx sx pending urine cx. Keflex, pyridium. Return precautions discussed.   Final Clinical Impressions(s) / ED Diagnoses   Final diagnoses:  Acute cystitis with hematuria    ED Discharge Orders         Ordered    cephALEXin (KEFLEX) 500 MG capsule  2 times daily     05/05/18 1744    phenazopyridine (PYRIDIUM) 200 MG tablet  3 times daily     05/05/18 1744           Burgess Amor,  PA-C 05/06/18 1636    Jacalyn Lefevre, MD 05/10/18 [phone]

## 2018-05-07 LAB — URINE CULTURE
Culture: 50000 — AB
SPECIAL REQUESTS: NORMAL

## 2018-05-11 ENCOUNTER — Other Ambulatory Visit: Payer: Self-pay

## 2018-05-11 MED ORDER — HYDROCODONE-ACETAMINOPHEN 7.5-325 MG PO TABS: 1.0000 | ORAL_TABLET | Freq: Four times a day (QID) | ORAL | 0 refills | Status: DC | PRN

## 2018-06-08 ENCOUNTER — Other Ambulatory Visit: Payer: Self-pay

## 2018-06-09 MED ORDER — HYDROCODONE-ACETAMINOPHEN 7.5-325 MG PO TABS: 1.0000 | ORAL_TABLET | Freq: Four times a day (QID) | ORAL | 0 refills | Status: DC | PRN

## 2018-07-05 ENCOUNTER — Other Ambulatory Visit: Payer: Self-pay

## 2018-07-06 MED ORDER — HYDROCODONE-ACETAMINOPHEN 7.5-325 MG PO TABS: 1.0000 | ORAL_TABLET | Freq: Four times a day (QID) | ORAL | 0 refills | Status: DC | PRN

## 2018-07-13 ENCOUNTER — Ambulatory Visit: Payer: Self-pay | Admitting: Orthopaedic Surgery

## 2018-08-04 ENCOUNTER — Other Ambulatory Visit: Payer: Self-pay

## 2018-08-04 ENCOUNTER — Ambulatory Visit: Payer: Self-pay | Admitting: Orthopaedic Surgery

## 2018-08-04 ENCOUNTER — Encounter: Payer: Self-pay | Admitting: Orthopaedic Surgery

## 2018-08-04 VITALS — BP 176/81 | HR 68 | Ht 67.0 in | Wt 279.0 lb

## 2018-08-04 DIAGNOSIS — Z6841 Body Mass Index (BMI) 40.0 and over, adult: Secondary | ICD-10-CM

## 2018-08-04 DIAGNOSIS — F17210 Nicotine dependence, cigarettes, uncomplicated: Secondary | ICD-10-CM

## 2018-08-04 DIAGNOSIS — G8929 Other chronic pain: Secondary | ICD-10-CM

## 2018-08-04 DIAGNOSIS — M545 Low back pain: Secondary | ICD-10-CM

## 2018-08-04 MED ORDER — HYDROCODONE-ACETAMINOPHEN 7.5-325 MG PO TABS: 1.0000 | ORAL_TABLET | Freq: Four times a day (QID) | ORAL | 0 refills | Status: DC | PRN

## 2018-08-04 NOTE — Progress Notes (Signed)
Patient Wendy Ramirez, female DOB:[birth year], 53 y.o. KNL:[id number]  Chief Complaint  Patient presents with  . Back Pain    HPI  Wendy Ramirez is a 53 y.o. female who has chronic lower back pain.  She has been doing her exercises and taking her medicine. She has more pain in cold weather and with increased activity.  She tries to be active.   Body mass index is 43.7 kg/m.  The patient meets the AMA guidelines for Morbid (severe) obesity with a BMI > 40.0 and I have recommended weight loss.   ROS  Review of Systems  HENT: Negative for congestion.   Respiratory: Positive for shortness of breath. Negative for cough.   Musculoskeletal: Positive for arthralgias, back pain and myalgias.  All other systems reviewed and are negative.   All other systems reviewed and are negative.  The following is a summary of the past history medically, past history surgically, known current medicines, social history and family history.  This information is gathered electronically by the computer from prior information and documentation.  I review this each visit and have found including this information at this point in the chart is beneficial and informative.    Past Medical History:  Diagnosis Date  . Atrial fibrillation (HCC)   . Back pain   . Diverticulitis   . Pneumonia   . SVT (supraventricular tachycardia) (HCC)     Past Surgical History:  Procedure Laterality Date  . CHOLECYSTECTOMY    . TUBAL LIGATION      Family History  Problem Relation Age of Onset  . Heart disease Father   . Atrial fibrillation Maternal Grandmother   . Heart attack Paternal Grandfather     Social History Social History   Tobacco Use  . Smoking status: Current Every Day Smoker    Packs/day: 1.00    Types: Cigarettes    Start date: 06/19/1993  . Smokeless tobacco: Never Used  Substance Use Topics  . Alcohol use: No  . Drug use: No    No Known Allergies  Current Outpatient Medications   Medication Sig Dispense Refill  . chlorthalidone (HYGROTON) 25 MG tablet Take 1 tablet (25 mg total) by mouth daily. 30 tablet 3  . HYDROcodone-acetaminophen (NORCO) 7.5-325 MG tablet Take 1 tablet by mouth every 6 (six) hours as needed for moderate pain (Must last 30 days). 45 tablet 0  . metoprolol tartrate (LOPRESSOR) 100 MG tablet Take 1 tablet (100 mg total) by mouth 2 (two) times daily. 180 tablet 3  . naproxen (NAPROSYN) 500 MG tablet Take 1 tablet (500 mg total) by mouth 2 (two) times daily with a meal. (Patient not taking: Reported on 04/13/2018) 60 tablet 5  . phenazopyridine (PYRIDIUM) 200 MG tablet Take 1 tablet (200 mg total) by mouth 3 (three) times daily. 6 tablet 0   No current facility-administered medications for this visit.      Physical Exam  Blood pressure (!) 176/81, pulse 68, height 5\' 7"  (1.702 m), weight 279 lb (126.6 kg).  Constitutional: overall normal hygiene, normal nutrition, well developed, normal grooming, normal body habitus. Assistive device:none  Musculoskeletal: gait and station Limp none, muscle tone and strength are normal, no tremors or atrophy is present.  .  Neurological: coordination overall normal.  Deep tendon reflex/nerve stretch intact.  Sensation normal.  Cranial nerves II-XII intact.   Skin:   Normal overall no scars, lesions, ulcers or rashes. No psoriasis.  Psychiatric: Alert and oriented x 3.  Recent memory  intact, remote memory unclear.  Normal mood and affect. Well groomed.  Good eye contact.  Cardiovascular: overall no swelling, no varicosities, no edema bilaterally, normal temperatures of the legs and arms, no clubbing, cyanosis and good capillary refill.  Lymphatic: palpation is normal.  Spine/Pelvis examination:  Inspection:  Overall, sacoiliac joint benign and hips nontender; without crepitus or defects.   Thoracic spine inspection: Alignment normal without kyphosis present   Lumbar spine inspection:  Alignment  with  normal lumbar lordosis, without scoliosis apparent.   Thoracic spine palpation:  without tenderness of spinal processes   Lumbar spine palpation: without tenderness of lumbar area; without tightness of lumbar muscles    Range of Motion:   Lumbar flexion, forward flexion is normal without pain or tenderness    Lumbar extension is full without pain or tenderness   Left lateral bend is normal without pain or tenderness   Right lateral bend is normal without pain or tenderness   Straight leg raising is normal  Strength & tone: normal   Stability overall normal stability  All other systems reviewed and are negative   The patient has been educated about the nature of the problem(s) and counseled on treatment options.  The patient appeared to understand what I have discussed and is in agreement with it.  Encounter Diagnoses  Name Primary?  . Chronic midline low back pain without sciatica Yes  . Cigarette nicotine dependence without complication   . Body mass index 40.0-44.9, adult (HCC)   . Morbid obesity (HCC)     PLAN Call if any problems.  Precautions discussed.  Continue current medications.   Return to clinic 3 months   I have reviewed the [facility] Controlled Substance Reporting System web site prior to prescribing narcotic medicine for this patient.   The patient has read and signed an Opioid Treatment Agreement which has been scanned and added to the medical record.  The patient understands the agreement and agrees to abide with it.  The patient has chronic pain that is being treated with an opioid which relieves the pain.  The patient understands potential complications with chronic opioid treatment.   Electronically Signed Darreld Mclean, MD 3/11/20202:27 PM

## 2018-08-30 ENCOUNTER — Other Ambulatory Visit: Payer: Self-pay

## 2018-08-31 MED ORDER — HYDROCODONE-ACETAMINOPHEN 7.5-325 MG PO TABS: 1.0000 | ORAL_TABLET | Freq: Four times a day (QID) | ORAL | 0 refills | Status: DC | PRN

## 2018-09-28 ENCOUNTER — Other Ambulatory Visit: Payer: Self-pay

## 2018-09-28 MED ORDER — HYDROCODONE-ACETAMINOPHEN 7.5-325 MG PO TABS: 1.0000 | ORAL_TABLET | Freq: Four times a day (QID) | ORAL | 0 refills | Status: DC | PRN

## 2018-10-28 ENCOUNTER — Encounter: Payer: Self-pay | Admitting: Orthopaedic Surgery

## 2018-10-28 ENCOUNTER — Other Ambulatory Visit: Payer: Self-pay

## 2018-10-28 ENCOUNTER — Ambulatory Visit: Payer: Self-pay | Admitting: Orthopaedic Surgery

## 2018-10-28 VITALS — BP 135/71 | HR 67 | Temp 97.9°F | Ht 67.0 in | Wt 285.0 lb

## 2018-10-28 DIAGNOSIS — M545 Low back pain: Secondary | ICD-10-CM

## 2018-10-28 DIAGNOSIS — F17210 Nicotine dependence, cigarettes, uncomplicated: Secondary | ICD-10-CM

## 2018-10-28 DIAGNOSIS — G8929 Other chronic pain: Secondary | ICD-10-CM

## 2018-10-28 DIAGNOSIS — Z6841 Body Mass Index (BMI) 40.0 and over, adult: Secondary | ICD-10-CM

## 2018-10-28 MED ORDER — HYDROCODONE-ACETAMINOPHEN 7.5-325 MG PO TABS: 1.0000 | ORAL_TABLET | Freq: Four times a day (QID) | ORAL | 0 refills | Status: DC | PRN

## 2018-10-28 NOTE — Progress Notes (Signed)
[name], female DOB:July 15, 1965, 53 y.o. [id number]  Chief Complaint  Patient presents with  . Back Pain    HPI  Wendy Ramirez is a 53 y.o. female who has chronic lower back pain. She has no sciatica. She has good and bad days.  She has no weakness, no new trauma. She is doing her exercises and taking her medicine.   Body mass index is 44.64 kg/m.  The patient meets the AMA guidelines for Morbid (severe) obesity with a BMI > 40.0 and I have recommended weight loss.   ROS  Review of Systems  All other systems reviewed and are negative.  The following is a summary of the past history medically, past history surgically, known current medicines, social history and family history.  This information is gathered electronically by the computer from prior information and documentation.  I review this each visit and have found including this information at this point in the chart is beneficial and informative.    Past Medical History:  Diagnosis Date  . Atrial fibrillation (HCC)   . Back pain   . Diverticulitis   . Pneumonia   . SVT (supraventricular tachycardia) (HCC)     Past Surgical History:  Procedure Laterality Date  . CHOLECYSTECTOMY    . TUBAL LIGATION      Family History  Problem Relation Age of Onset  . Heart disease Father   . Atrial fibrillation Maternal Grandmother   . Heart attack Paternal Grandfather     Social History Social History   Tobacco Use  . Smoking status: Current Every Day Smoker    Packs/day: 1.00    Types: Cigarettes    Start date: 06/19/1993  . Smokeless tobacco: Never Used  Substance Use Topics  . Alcohol use: No  . Drug use: No    No Known Allergies  Current Outpatient Medications  Medication Sig Dispense Refill  . chlorthalidone (HYGROTON) 25 MG tablet Take 1 tablet (25 mg total) by mouth daily. 30 tablet 3  . HYDROcodone-acetaminophen (NORCO) 7.5-325 MG tablet Take 1 tablet by mouth every 6 (six) hours as  needed for moderate pain (Must last 30 days). 45 tablet 0  . metoprolol tartrate (LOPRESSOR) 100 MG tablet Take 1 tablet (100 mg total) by mouth 2 (two) times daily. 180 tablet 3  . naproxen (NAPROSYN) 500 MG tablet Take 1 tablet (500 mg total) by mouth 2 (two) times daily with a meal. (Patient not taking: Reported on 04/13/2018) 60 tablet 5  . phenazopyridine (PYRIDIUM) 200 MG tablet Take 1 tablet (200 mg total) by mouth 3 (three) times daily. 6 tablet 0   No current facility-administered medications for this visit.      Physical Exam  Blood pressure 135/71, pulse 67, temperature 97.9 F (36.6 C), height 5\' 7"  (1.702 m), weight 285 lb (129.3 kg).  Constitutional: overall normal hygiene, normal nutrition, well developed, normal grooming, normal body habitus. Assistive device:none  Musculoskeletal: gait and station Limp none, muscle tone and strength are normal, no tremors or atrophy is present.  .  Neurological: coordination overall normal.  Deep tendon reflex/nerve stretch intact.  Sensation normal.  Cranial nerves II-XII intact.   Skin:   Normal overall no scars, lesions, ulcers or rashes. No psoriasis.  Psychiatric: Alert and oriented x 3.  Recent memory intact, remote memory unclear.  Normal mood and affect. Well groomed.  Good eye contact.  Cardiovascular: overall no swelling, no varicosities, no edema bilaterally, normal temperatures of the legs and arms,  no clubbing, cyanosis and good capillary refill.  Lymphatic: palpation is normal.  Spine/Pelvis examination:  Inspection:  Overall, sacoiliac joint benign and hips nontender; without crepitus or defects.   Thoracic spine inspection: Alignment normal without kyphosis present   Lumbar spine inspection:  Alignment  with normal lumbar lordosis, without scoliosis apparent.   Thoracic spine palpation:  without tenderness of spinal processes   Lumbar spine palpation: without tenderness of lumbar area; without tightness of lumbar  muscles    Range of Motion:   Lumbar flexion, forward flexion is normal without pain or tenderness    Lumbar extension is full without pain or tenderness   Left lateral bend is normal without pain or tenderness   Right lateral bend is normal without pain or tenderness   Straight leg raising is normal  Strength & tone: normal   Stability overall normal stability  All other systems reviewed and are negative   The patient has been educated about the nature of the problem(s) and counseled on treatment options.  The patient appeared to understand what I have discussed and is in agreement with it.  Encounter Diagnoses  Name Primary?  . Chronic midline low back pain without sciatica Yes  . Cigarette nicotine dependence without complication   . Body mass index 40.0-44.9, adult (HCC)   . Morbid obesity (HCC)     PLAN Call if any problems.  Precautions discussed.  Continue current medications.   Return to clinic 3 months   The patient has read and signed an Opioid Treatment Agreement which has been scanned and added to the medical record.  The patient understands the agreement and agrees to abide with it.  The patient has chronic pain that is being treated with an opioid which relieves the pain.  The patient understands potential complications with chronic opioid treatment.   I have reviewed the [place] Controlled Substance Reporting System web site prior to prescribing narcotic medicine for this patient.   Electronically Signed Darreld Mclean, MD 6/4/202010:26 AM

## 2018-11-02 ENCOUNTER — Ambulatory Visit: Payer: Self-pay | Admitting: Orthopaedic Surgery

## 2018-11-22 ENCOUNTER — Other Ambulatory Visit: Payer: Self-pay

## 2018-11-22 MED ORDER — HYDROCODONE-ACETAMINOPHEN 7.5-325 MG PO TABS: 1.0000 | ORAL_TABLET | Freq: Four times a day (QID) | ORAL | 0 refills | Status: DC | PRN

## 2018-12-20 ENCOUNTER — Other Ambulatory Visit: Payer: Self-pay

## 2018-12-21 MED ORDER — HYDROCODONE-ACETAMINOPHEN 7.5-325 MG PO TABS: 1.0000 | ORAL_TABLET | Freq: Four times a day (QID) | ORAL | 0 refills | Status: DC | PRN

## 2019-01-17 ENCOUNTER — Other Ambulatory Visit: Payer: Self-pay

## 2019-01-24 MED ORDER — HYDROCODONE-ACETAMINOPHEN 7.5-325 MG PO TABS: 1.0000 | ORAL_TABLET | Freq: Four times a day (QID) | ORAL | 0 refills | Status: DC | PRN

## 2019-01-27 ENCOUNTER — Other Ambulatory Visit: Payer: Self-pay

## 2019-01-27 ENCOUNTER — Encounter: Payer: Self-pay | Admitting: Orthopaedic Surgery

## 2019-01-27 ENCOUNTER — Ambulatory Visit: Payer: Self-pay | Admitting: Orthopaedic Surgery

## 2019-01-27 VITALS — BP 152/69 | HR 61 | Ht 67.0 in | Wt 277.0 lb

## 2019-01-27 DIAGNOSIS — G8929 Other chronic pain: Secondary | ICD-10-CM

## 2019-01-27 DIAGNOSIS — Z6841 Body Mass Index (BMI) 40.0 and over, adult: Secondary | ICD-10-CM

## 2019-01-27 DIAGNOSIS — F17210 Nicotine dependence, cigarettes, uncomplicated: Secondary | ICD-10-CM

## 2019-01-27 DIAGNOSIS — M545 Low back pain: Secondary | ICD-10-CM

## 2019-01-27 NOTE — Progress Notes (Signed)
Patient TO:IZTIWPY Wendy Ramirez, female DOB:27-Feb-1966, 53 y.o. KDX:[id number]  Chief Complaint  Patient presents with  . Back Pain    HPI  Wendy Ramirez is a 53 y.o. female who has chronic lower back pain. She has no new trauma.  She has had some bad days this summer with her back but today is feeling much better. She is taking her medicine and doing her exercises. She has lost 15 pounds since last visit.   Body mass index is 43.38 kg/m.  The patient meets the AMA guidelines for Morbid (severe) obesity with a BMI > 40.0 and I have recommended weight loss.   ROS  Review of Systems  All other systems reviewed and are negative.  The following is a summary of the past history medically, past history surgically, known current medicines, social history and family history.  This information is gathered electronically by the computer from prior information and documentation.  I review this each visit and have found including this information at this point in the chart is beneficial and informative.    Past Medical History:  Diagnosis Date  . Atrial fibrillation ([place])   . Back pain   . Diverticulitis   . Pneumonia   . SVT (supraventricular tachycardia) (HCC)     Past Surgical History:  Procedure Laterality Date  . CHOLECYSTECTOMY    . TUBAL LIGATION      Family History  Problem Relation Age of Onset  . Heart disease Father   . Atrial fibrillation Maternal Grandmother   . Heart attack Paternal Grandfather     Social History Social History   Tobacco Use  . Smoking status: Current Every Day Smoker    Packs/day: 1.00    Types: Cigarettes    Start date: 06/19/1993  . Smokeless tobacco: Never Used  Substance Use Topics  . Alcohol use: No  . Drug use: No    No Known Allergies  Current Outpatient Medications  Medication Sig Dispense Refill  . HYDROcodone-acetaminophen (NORCO) 7.5-325 MG tablet Take 1 tablet by mouth every 6 (six) hours as needed for moderate pain (Must  last 30 days). 45 tablet 0  . metoprolol tartrate (LOPRESSOR) 100 MG tablet Take 1 tablet (100 mg total) by mouth 2 (two) times daily. 180 tablet 3  . naproxen (NAPROSYN) 500 MG tablet Take 1 tablet (500 mg total) by mouth 2 (two) times daily with a meal. 60 tablet 5  . phenazopyridine (PYRIDIUM) 200 MG tablet Take 1 tablet (200 mg total) by mouth 3 (three) times daily. 6 tablet 0  . chlorthalidone (HYGROTON) 25 MG tablet Take 1 tablet (25 mg total) by mouth daily. 30 tablet 3   No current facility-administered medications for this visit.      Physical Exam  Blood pressure (!) 152/69, pulse 61, height 5\' 7"  (1.702 m), weight 277 lb (125.6 kg).  Constitutional: overall normal hygiene, normal nutrition, well developed, normal grooming, normal body habitus. Assistive device:none  Musculoskeletal: gait and station Limp none, muscle tone and strength are normal, no tremors or atrophy is present.  .  Neurological: coordination overall normal.  Deep tendon reflex/nerve stretch intact.  Sensation normal.  Cranial nerves II-XII intact.   Skin:   Normal overall no scars, lesions, ulcers or rashes. No psoriasis.  Psychiatric: Alert and oriented x 3.  Recent memory intact, remote memory unclear.  Normal mood and affect. Well groomed.  Good eye contact.  Cardiovascular: overall no swelling, no varicosities, no edema bilaterally, normal temperatures of the  legs and arms, no clubbing, cyanosis and good capillary refill.  Lymphatic: palpation is normal.  Spine/Pelvis examination:  Inspection:  Overall, sacoiliac joint benign and hips nontender; without crepitus or defects.   Thoracic spine inspection: Alignment normal without kyphosis present   Lumbar spine inspection:  Alignment  with normal lumbar lordosis, without scoliosis apparent.   Thoracic spine palpation:  without tenderness of spinal processes   Lumbar spine palpation: without tenderness of lumbar area; without tightness of lumbar  muscles    Range of Motion:   Lumbar flexion, forward flexion is normal without pain or tenderness    Lumbar extension is full without pain or tenderness   Left lateral bend is normal without pain or tenderness   Right lateral bend is normal without pain or tenderness   Straight leg raising is normal  Strength & tone: normal   Stability overall normal stability  All other systems reviewed and are negative   The patient has been educated about the nature of the problem(s) and counseled on treatment options.  The patient appeared to understand what I have discussed and is in agreement with it.  Encounter Diagnoses  Name Primary?  . Chronic midline low back pain without sciatica Yes  . Cigarette nicotine dependence without complication   . Body mass index 40.0-44.9, adult (HCC)   . Morbid obesity (HCC)     PLAN Call if any problems.  Precautions discussed.  Continue current medications.   Return to clinic 3 months   Electronically Signed Darreld McleanWayne Trenia Tennyson, MD 9/3/20209:57 AM

## 2019-02-21 ENCOUNTER — Other Ambulatory Visit: Payer: Self-pay

## 2019-03-01 MED ORDER — HYDROCODONE-ACETAMINOPHEN 7.5-325 MG PO TABS: 1.0000 | ORAL_TABLET | Freq: Four times a day (QID) | ORAL | 0 refills | Status: DC | PRN

## 2019-03-02 ENCOUNTER — Other Ambulatory Visit: Payer: Self-pay

## 2019-03-02 MED ORDER — METOPROLOL TARTRATE 100 MG PO TABS: 100.0000 mg | ORAL_TABLET | Freq: Two times a day (BID) | ORAL | 3 refills | Status: DC

## 2019-03-14 ENCOUNTER — Emergency Department (HOSPITAL_COMMUNITY): Payer: Self-pay

## 2019-03-14 ENCOUNTER — Emergency Department (HOSPITAL_COMMUNITY)
Admission: EM | Admit: 2019-03-14 | Discharge: 2019-03-15 | Disposition: A | Discharge status: Home / Self Care | Payer: Self-pay | Attending: Emergency Medicine | Admitting: Emergency Medicine

## 2019-03-14 ENCOUNTER — Encounter (HOSPITAL_COMMUNITY): Payer: Self-pay | Admitting: Emergency Medicine

## 2019-03-14 ENCOUNTER — Other Ambulatory Visit: Payer: Self-pay

## 2019-03-14 DIAGNOSIS — Z79899 Other long term (current) drug therapy: Secondary | ICD-10-CM | POA: Insufficient documentation

## 2019-03-14 DIAGNOSIS — R002 Palpitations: Secondary | ICD-10-CM

## 2019-03-14 DIAGNOSIS — F17210 Nicotine dependence, cigarettes, uncomplicated: Secondary | ICD-10-CM | POA: Insufficient documentation

## 2019-03-14 DIAGNOSIS — R072 Precordial pain: Secondary | ICD-10-CM

## 2019-03-14 LAB — BASIC METABOLIC PANEL
Anion gap: 8 (ref 5–15)
BUN: 17 mg/dL (ref 6–20)
CO2: 27 mmol/L (ref 22–32)
Calcium: 9 mg/dL (ref 8.9–10.3)
Chloride: 108 mmol/L (ref 98–111)
Creatinine, Ser: 0.71 mg/dL (ref 0.44–1.00)
GFR calc Af Amer: >60 mL/min (ref >60)
GFR calc non Af Amer: >60 mL/min (ref >60)
Glucose, Bld: 110 mg/dL — ABNORMAL HIGH (ref 70–99)
Potassium: 4.5 mmol/L (ref 3.5–5.1)
Sodium: 143 mmol/L (ref 135–145)

## 2019-03-14 LAB — TROPONIN I (HIGH SENSITIVITY): Troponin I (High Sensitivity): 3 ng/L (ref <18)

## 2019-03-14 LAB — CBC
HCT: 51.9 % — ABNORMAL HIGH (ref 36.0–46.0)
Hemoglobin: 16.6 g/dL — ABNORMAL HIGH (ref 12.0–15.0)
MCH: 31.6 pg (ref 26.0–34.0)
MCHC: 32 g/dL (ref 30.0–36.0)
MCV: 98.9 fL (ref 80.0–100.0)
Platelets: 217 10*3/uL (ref 150–400)
RBC: 5.25 MIL/uL — ABNORMAL HIGH (ref 3.87–5.11)
RDW: 12.1 % (ref 11.5–15.5)
WBC: 9.8 10*3/uL (ref 4.0–10.5)
nRBC: 0 % (ref 0.0–0.2)

## 2019-03-14 MED ORDER — SODIUM CHLORIDE 0.9% FLUSH: 3.0000 mL | Freq: Once | INTRAVENOUS | Status: DC

## 2019-03-14 NOTE — ED Triage Notes (Signed)
Pt c/o chest tightness that started earlier today. She states for the last couple days heart feels like it is "flopping".

## 2019-03-15 NOTE — ED Provider Notes (Signed)
[facility] EMERGENCY DEPARTMENT Provider Note   CSN: [id number] Arrival date & time: 03/14/19  2243     History   Chief Complaint Chief Complaint  Patient presents with   Chest Pain    HPI Wendy Ramirez is a 53 y.o. female.  HPI: A 53 year old patient with a history of hypertension presents for evaluation of chest pain. Initial onset of pain was more than 6 hours ago. The patient's chest pain is not worse with exertion. The patient's chest pain is middle- or left-sided, is not well-localized, is not described as heaviness/pressure/tightness, is not sharp and does not radiate to the arms/jaw/neck. The patient does not complain of nausea and denies diaphoresis. The patient has smoked in the past 90 days and has a family history of coronary artery disease in a first-degree relative with onset less than age 75. The patient has no history of stroke, has no history of peripheral artery disease, denies any history of treated diabetes, has no history of hypercholesterolemia and does not have an elevated BMI (>=30).   HPI Patient reports onset of a "humming" in her chest for several days.  She reports at times it feels that her chest is "flopping "she reports mild shortness of breath. No fevers or vomiting.  No coughing.  No new lower extremity edema.  She reports history of atrial fibrillation, and this feels similar to prior episodes. She is no longer on anticoagulation due to cost and adverse side effects. Past Medical History:  Diagnosis Date   Atrial fibrillation ([place])    Back pain    Diverticulitis    Pneumonia    SVT (supraventricular tachycardia) ([place])     There are no active problems to display for this patient.   Past Surgical History:  Procedure Laterality Date   CHOLECYSTECTOMY     TUBAL LIGATION       OB History    Gravida  4   Para  4   Term  4   Preterm      AB      Living        SAB      TAB      Ectopic      Multiple      Live Births                Home Medications    Prior to Admission medications   Medication Sig Start Date End Date Taking? Authorizing Provider  chlorthalidone (HYGROTON) 25 MG tablet Take 1 tablet (25 mg total) by mouth daily. 11/13/16 02/11/17  Arnoldo Lenis, MD  HYDROcodone-acetaminophen (NORCO) 7.5-325 MG tablet Take 1 tablet by mouth every 6 (six) hours as needed for moderate pain (Must last 30 days). 03/01/19   Sanjuana Kava, MD  metoprolol tartrate (LOPRESSOR) 100 MG tablet Take 1 tablet (100 mg total) by mouth 2 (two) times daily. 03/02/19   Arnoldo Lenis, MD  naproxen (NAPROSYN) 500 MG tablet Take 1 tablet (500 mg total) by mouth 2 (two) times daily with a meal. 02/12/17   Sanjuana Kava, MD  phenazopyridine (PYRIDIUM) 200 MG tablet Take 1 tablet (200 mg total) by mouth 3 (three) times daily. 05/05/18   Evalee Jefferson, PA-C    Family History Family History  Problem Relation Age of Onset   Heart disease Father    Atrial fibrillation Maternal Grandmother    Heart attack Paternal Grandfather     Social History Social History   Tobacco Use   Smoking status:  Current Every Day Smoker    Packs/day: 1.00    Types: Cigarettes    Start date: 06/19/1993   Smokeless tobacco: Never Used  Substance Use Topics   Alcohol use: No   Drug use: No     Allergies   Patient has no known allergies.   Review of Systems Review of Systems  Constitutional: Negative for fever.  Respiratory: Positive for shortness of breath.   Cardiovascular: Positive for palpitations. Negative for leg swelling.  Gastrointestinal: Negative for vomiting.  Neurological: Negative for syncope.  Psychiatric/Behavioral: The patient is nervous/anxious.   All other systems reviewed and are negative.    Physical Exam Updated Vital Signs BP (!) 170/103    Pulse 78    Temp 98.1 F (36.7 C)    Resp 18    Ht 1.702 m (5\' 7" )    Wt 120.2 kg    LMP 05/23/2013    SpO2 99%    BMI 41.50 kg/m   Physical  Exam CONSTITUTIONAL: Well developed/well nourished HEAD: Normocephalic/atraumatic EYES: EOMI/PERRL ENMT: Mucous membranes moist NECK: supple no meningeal signs SPINE/BACK:entire spine nontender CV: S1/S2 noted, no murmurs/rubs/gallops noted LUNGS: Lungs are clear to auscultation bilaterally, no apparent distress ABDOMEN: soft, nontender, no rebound or guarding, bowel sounds noted throughout abdomen GU:no cva tenderness NEURO: Pt is awake/alert/appropriate, moves all extremitiesx4.  No facial droop.   EXTREMITIES: pulses normal/equal, full ROM, no lower extremity edema or tenderness SKIN: warm, color normal PSYCH: Mildly anxious   ED Treatments / Results  Labs (all labs ordered are listed, but only abnormal results are displayed) Labs Reviewed  BASIC METABOLIC PANEL - Abnormal; Notable for the following components:      Result Value   Glucose, Bld 110 (*)    All other components within normal limits  CBC - Abnormal; Notable for the following components:   RBC 5.25 (*)    Hemoglobin 16.6 (*)    HCT 51.9 (*)    All other components within normal limits  TROPONIN I (HIGH SENSITIVITY)    EKG EKG Interpretation  Date/Time:  Monday March 14 2019 23:00:11 EDT Ventricular Rate:  75 PR Interval:    QRS Duration: 91 QT Interval:  409 QTC Calculation: 457 R Axis:   -23 Text Interpretation:  Sinus rhythm Borderline left axis deviation Low voltage, precordial leads Confirmed by Zadie RhineWickline, Tanika Bracco ([id number]) on 03/14/2019 11:20:34 PM   Radiology Dg Chest 2 View  Result Date: 03/14/2019 CLINICAL DATA:  Chest pain. Chest tightness today. EXAM: CHEST - 2 VIEW COMPARISON:  Radiograph 06/18/2016 FINDINGS: The cardiomediastinal contours are normal. Slight bronchial thickening. Pulmonary vasculature is normal. No consolidation, pleural effusion, or pneumothorax. No acute osseous abnormalities are seen. IMPRESSION: Slight bronchial thickening, can be seen with bronchitis or asthma. Otherwise  unremarkable radiographs of the chest. Electronically Signed   By: Narda RutherfordMelanie  Sanford M.D.   On: 03/14/2019 23:48    Procedures Procedures  Medications Ordered in ED Medications  sodium chloride flush (NS) 0.9 % injection 3 mL (has no administration in time range)     Initial Impression / Assessment and Plan / ED Course  I have reviewed the triage vital signs and the nursing notes.  Pertinent labs & imaging results that were available during my care of the patient were reviewed by me and considered in my medical decision making (see chart for details).     HEAR Score: 3  12:32 AM Patient presents with symptoms that could be consistent with palpitations.  She reports a history  of A. fib and thought she was back in this rhythm.  Initial EKG and monitoring in the ER reveals sinus rhythm. However she could be having paroxysmal A. fib as an outpatient.  She is no longer on anticoagulation due to cost and adverse effects.  I did recommend a full dose aspirin 324 mg daily since she is not able to tolerate any other anticoagulation.  Low suspicion for ACS/PE/dissection.  She felt immediately better after my initial history taking.   Patient well-appearing.  She appears improved.  She will follow-up with her cardiologist.   Discussed smoking cessation with patient and was they were offerred resources to help stop.  Total time was 5 min CPT code 12878.    Final Clinical Impressions(s) / ED Diagnoses   Final diagnoses:  Precordial pain  Palpitations    ED Discharge Orders    None       Zadie Rhine, MD 03/15/19 0128

## 2019-03-28 ENCOUNTER — Other Ambulatory Visit: Payer: Self-pay

## 2019-03-29 MED ORDER — HYDROCODONE-ACETAMINOPHEN 7.5-325 MG PO TABS: 1.0000 | ORAL_TABLET | Freq: Four times a day (QID) | ORAL | 0 refills | Status: DC | PRN

## 2019-04-26 ENCOUNTER — Other Ambulatory Visit: Payer: Self-pay

## 2019-04-26 MED ORDER — HYDROCODONE-ACETAMINOPHEN 7.5-325 MG PO TABS: 1.0000 | ORAL_TABLET | Freq: Four times a day (QID) | ORAL | 0 refills | Status: DC | PRN

## 2019-04-28 ENCOUNTER — Ambulatory Visit
Admission: EM | Admit: 2019-04-28 | Discharge: 2019-04-28 | Disposition: A | Discharge status: Home / Self Care | Payer: BC Managed Care – PPO | Attending: Emergency Medicine | Admitting: Emergency Medicine

## 2019-04-28 ENCOUNTER — Ambulatory Visit: Payer: Self-pay | Admitting: Orthopaedic Surgery

## 2019-04-28 ENCOUNTER — Other Ambulatory Visit: Payer: Self-pay

## 2019-04-28 DIAGNOSIS — N3001 Acute cystitis with hematuria: Secondary | ICD-10-CM

## 2019-04-28 LAB — POCT URINALYSIS DIP (MANUAL ENTRY)
Bilirubin, UA: NEGATIVE
Glucose, UA: NEGATIVE mg/dL
Ketones, POC UA: NEGATIVE mg/dL
Leukocytes, UA: NEGATIVE
Nitrite, UA: NEGATIVE
Protein Ur, POC: NEGATIVE mg/dL
Spec Grav, UA: >=1.03 — AB (ref 1.010–1.025)
Urobilinogen, UA: 0.2 E.U./dL
pH, UA: 6 (ref 5.0–8.0)

## 2019-04-28 MED ORDER — NITROFURANTOIN MONOHYD MACRO 100 MG PO CAPS: 100.0000 mg | ORAL_CAPSULE | Freq: Two times a day (BID) | ORAL | 0 refills | Status: DC

## 2019-04-28 NOTE — ED Provider Notes (Signed)
[facility]   CC: UTI  SUBJECTIVE:  Wendy Ramirez is a 53 y.o. female who complains of dysuria and bladder spasms x 6 days.  Admits to excessive caffeine intake and delayed bathroom breaks.  Reports intermittent bladder spasm with urination.  Has tried OTC AZO  With relief.  Symptoms are made worse with urination.  Admits to similar symptoms in the past.  Denies fever, chills, nausea, vomiting, abdominal pain, flank pain, abnormal vaginal discharge, vaginal odor, vaginal itching, vaginal pain, hematuria.    LMP: Patient's last menstrual period was 05/23/2013.  ROS: As in HPI.  All other pertinent ROS negative.     Past Medical History:  Diagnosis Date  . Atrial fibrillation ([place])   . Back pain   . Diverticulitis   . Pneumonia   . SVT (supraventricular tachycardia) ([facility])    Past Surgical History:  Procedure Laterality Date  . CHOLECYSTECTOMY    . TUBAL LIGATION     No Known Allergies No current facility-administered medications on file prior to encounter.    Current Outpatient Medications on File Prior to Encounter  Medication Sig Dispense Refill  . chlorthalidone (HYGROTON) 25 MG tablet Take 1 tablet (25 mg total) by mouth daily. 30 tablet 3  . HYDROcodone-acetaminophen (NORCO) 7.5-325 MG tablet Take 1 tablet by mouth every 6 (six) hours as needed for moderate pain (Must last 30 days). 45 tablet 0  . metoprolol tartrate (LOPRESSOR) 100 MG tablet Take 1 tablet (100 mg total) by mouth 2 (two) times daily. 180 tablet 3  . naproxen (NAPROSYN) 500 MG tablet Take 1 tablet (500 mg total) by mouth 2 (two) times daily with a meal. 60 tablet 5  . phenazopyridine (PYRIDIUM) 200 MG tablet Take 1 tablet (200 mg total) by mouth 3 (three) times daily. 6 tablet 0   Social History   Socioeconomic History  . Marital status: Married    Spouse name: Not on file  . Number of children: Not on file  . Years of education: Not on file  . Highest education level: Not on file   Occupational History  . Not on file  Social Needs  . Financial resource strain: Not on file  . Food insecurity    Worry: Not on file    Inability: Not on file  . Transportation needs    Medical: Not on file    Non-medical: Not on file  Tobacco Use  . Smoking status: Current Every Day Smoker    Packs/day: 1.00    Types: Cigarettes    Start date: 06/19/1993  . Smokeless tobacco: Never Used  Substance and Sexual Activity  . Alcohol use: No  . Drug use: No  . Sexual activity: Not on file  Lifestyle  . Physical activity    Days per week: Not on file    Minutes per session: Not on file  . Stress: Not on file  Relationships  . Social Herbalist on phone: Not on file    Gets together: Not on file    Attends religious service: Not on file    Active member of club or organization: Not on file    Attends meetings of clubs or organizations: Not on file    Relationship status: Not on file  . Intimate partner violence    Fear of current or ex partner: Not on file    Emotionally abused: Not on file    Physically abused: Not on file    Forced sexual  activity: Not on file  Other Topics Concern  . Not on file  Social History Narrative  . Not on file   Family History  Problem Relation Age of Onset  . Heart disease Father   . Atrial fibrillation Maternal Grandmother   . Heart attack Paternal Grandfather     OBJECTIVE:  Vitals:   04/28/19 1717  BP: (!) 180/85  Pulse: 72  Resp: 20  Temp: 98.2 F (36.8 C)  TempSrc: Oral  SpO2: 93%   General appearance: AO in no acute distress HEENT: NCAT.  Oropharynx clear.  Lungs: clear to auscultation bilaterally without adventitious breath sounds Heart: regular rate and rhythm.  Abdomen: soft; non-distended; no tenderness; bowel sounds present; no guarding Back: no CVA tenderness Extremities: no edema; symmetrical with no gross deformities Skin: warm and dry Neurologic: Ambulates from chair to exam table without difficulty  Psychological: alert and cooperative; normal mood and affect  Labs Reviewed  POCT URINALYSIS DIP (MANUAL ENTRY) - Abnormal; Notable for the following components:      Result Value   Spec Grav, UA >=1.030 (*)    Blood, UA small (*)    All other components within normal limits  URINE CULTURE    ASSESSMENT & PLAN:  1. Acute cystitis with hematuria     Meds ordered this encounter  Medications  . nitrofurantoin, macrocrystal-monohydrate, (MACROBID) 100 MG capsule    Sig: Take 1 capsule (100 mg total) by mouth 2 (two) times daily.    Dispense:  10 capsule    Refill:  0    Order Specific Question:   Supervising Provider    Answer:   Eustace Moore [[id number]]   Urine culture sent.  We will call you with the abnormal results.   Push fluids and get plenty of rest.   Take antibiotic as directed and to completion Continue with AZO and as needed for symptomatic relief Follow up with PCP if symptoms persists Return here or go to ER if you have any new or worsening symptoms such as fever, worsening abdominal pain, nausea/vomiting, flank pain, symptoms do not improve with antibiotic, etc...  Outlined signs and symptoms indicating need for more acute intervention. Patient verbalized understanding. After Visit Summary given.     Rennis Harding, PA-C 04/28/19 1824

## 2019-04-28 NOTE — ED Triage Notes (Signed)
Pt presents to UC w/ c/o burning and spasming after urination x6 days. Pt states she's been taking AZO which has helped w/ the symptoms.

## 2019-04-28 NOTE — Discharge Instructions (Addendum)
Urine culture sent.  We will call you with the abnormal results.   Push fluids and get plenty of rest.   Take antibiotic as directed and to completion Continue with AZO and as needed for symptomatic relief Follow up with PCP if symptoms persists Return here or go to ER if you have any new or worsening symptoms such as fever, worsening abdominal pain, nausea/vomiting, flank pain, symptoms do not improve with antibiotic, etc..Marland Kitchen

## 2019-04-30 LAB — URINE CULTURE
Culture: NO GROWTH
Special Requests: NORMAL

## 2019-05-16 ENCOUNTER — Encounter: Payer: Self-pay | Admitting: Orthopaedic Surgery

## 2019-05-16 ENCOUNTER — Other Ambulatory Visit: Payer: Self-pay

## 2019-05-16 ENCOUNTER — Ambulatory Visit: Payer: BC Managed Care – PPO | Admitting: Orthopaedic Surgery

## 2019-05-16 VITALS — BP 183/125 | HR 64 | Temp 97.2°F | Ht 67.0 in | Wt 277.0 lb

## 2019-05-16 DIAGNOSIS — Z6841 Body Mass Index (BMI) 40.0 and over, adult: Secondary | ICD-10-CM | POA: Diagnosis not present

## 2019-05-16 DIAGNOSIS — F17210 Nicotine dependence, cigarettes, uncomplicated: Secondary | ICD-10-CM | POA: Diagnosis not present

## 2019-05-16 DIAGNOSIS — M545 Low back pain, unspecified: Secondary | ICD-10-CM

## 2019-05-16 DIAGNOSIS — G8929 Other chronic pain: Secondary | ICD-10-CM

## 2019-05-16 NOTE — Progress Notes (Signed)
Patient Wendy Ramirez, female DOB:July 21, 1965, 53 y.o. SHU:[id number]  Chief Complaint  Patient presents with  . Back Pain    It hurts the same as always    HPI  NANCE MCCOMBS is a 54 y.o. female who has chronic lower back pain.  She has no weakness, no new trauma. She has good and bad days.  She is taking her medicine.  She is still smoking and has no desire to cut back.    Body mass index is 43.38 kg/m.  The patient meets the AMA guidelines for Morbid (severe) obesity with a BMI > 40.0 and I have recommended weight loss.   ROS  Review of Systems  HENT: Negative for congestion.   Respiratory: Positive for shortness of breath. Negative for cough.   Musculoskeletal: Positive for arthralgias, back pain and myalgias.  All other systems reviewed and are negative.   All other systems reviewed and are negative.  The following is a summary of the past history medically, past history surgically, known current medicines, social history and family history.  This information is gathered electronically by the computer from prior information and documentation.  I review this each visit and have found including this information at this point in the chart is beneficial and informative.    Past Medical History:  Diagnosis Date  . Atrial fibrillation ([place])   . Back pain   . Diverticulitis   . Pneumonia   . SVT (supraventricular tachycardia) ([facility])     Past Surgical History:  Procedure Laterality Date  . CHOLECYSTECTOMY    . TUBAL LIGATION      Family History  Problem Relation Age of Onset  . Heart disease Father   . Atrial fibrillation Maternal Grandmother   . Heart attack Paternal Grandfather     Social History Social History   Tobacco Use  . Smoking status: Current Every Day Smoker    Packs/day: 1.00    Types: Cigarettes    Start date: 06/19/1993  . Smokeless tobacco: Never Used  Substance Use Topics  . Alcohol use: No  . Drug use: No    No Known  Allergies  Current Outpatient Medications  Medication Sig Dispense Refill  . chlorthalidone (HYGROTON) 25 MG tablet Take 1 tablet (25 mg total) by mouth daily. 30 tablet 3  . HYDROcodone-acetaminophen (NORCO) 7.5-325 MG tablet Take 1 tablet by mouth every 6 (six) hours as needed for moderate pain (Must last 30 days). 45 tablet 0  . metoprolol tartrate (LOPRESSOR) 100 MG tablet Take 1 tablet (100 mg total) by mouth 2 (two) times daily. 180 tablet 3  . naproxen (NAPROSYN) 500 MG tablet Take 1 tablet (500 mg total) by mouth 2 (two) times daily with a meal. 60 tablet 5  . nitrofurantoin, macrocrystal-monohydrate, (MACROBID) 100 MG capsule Take 1 capsule (100 mg total) by mouth 2 (two) times daily. 10 capsule 0  . phenazopyridine (PYRIDIUM) 200 MG tablet Take 1 tablet (200 mg total) by mouth 3 (three) times daily. 6 tablet 0   No current facility-administered medications for this visit.     Physical Exam  Blood pressure (!) 183/125, pulse 64, temperature (!) 97.2 F (36.2 C), height 5\' 7"  (1.702 m), weight 277 lb (125.6 kg), last menstrual period 05/23/2013.  Constitutional: overall normal hygiene, normal nutrition, well developed, normal grooming, normal body habitus. Assistive device:none  Musculoskeletal: gait and station Limp none, muscle tone and strength are normal, no tremors or atrophy is present.  .  Neurological: coordination overall  normal.  Deep tendon reflex/nerve stretch intact.  Sensation normal.  Cranial nerves II-XII intact.   Skin:   Normal overall no scars, lesions, ulcers or rashes. No psoriasis.  Psychiatric: Alert and oriented x 3.  Recent memory intact, remote memory unclear.  Normal mood and affect. Well groomed.  Good eye contact.  Cardiovascular: overall no swelling, no varicosities, no edema bilaterally, normal temperatures of the legs and arms, no clubbing, cyanosis and good capillary refill.  Lymphatic: palpation is normal.  Spine/Pelvis  examination:  Inspection:  Overall, sacoiliac joint benign and hips nontender; without crepitus or defects.   Thoracic spine inspection: Alignment normal without kyphosis present   Lumbar spine inspection:  Alignment  with normal lumbar lordosis, without scoliosis apparent.   Thoracic spine palpation:  without tenderness of spinal processes   Lumbar spine palpation: without tenderness of lumbar area; without tightness of lumbar muscles    Range of Motion:   Lumbar flexion, forward flexion is normal without pain or tenderness    Lumbar extension is full without pain or tenderness   Left lateral bend is normal without pain or tenderness   Right lateral bend is normal without pain or tenderness   Straight leg raising is normal  Strength & tone: normal   Stability overall normal stability  All other systems reviewed and are negative   The patient has been educated about the nature of the problem(s) and counseled on treatment options.  The patient appeared to understand what I have discussed and is in agreement with it.  Encounter Diagnoses  Name Primary?  . Chronic midline low back pain without sciatica Yes  . Morbid obesity ([facility])   . Cigarette nicotine dependence without complication   . Body mass index 40.0-44.9, adult [facility])     PLAN Call if any problems.  Precautions discussed.  Continue current medications.   Return to clinic 3 months   Electronically Signed Darreld Mclean, MD 12/21/20202:36 PM

## 2019-05-23 ENCOUNTER — Other Ambulatory Visit: Payer: Self-pay

## 2019-05-23 MED ORDER — HYDROCODONE-ACETAMINOPHEN 7.5-325 MG PO TABS: 1.0000 | ORAL_TABLET | Freq: Four times a day (QID) | ORAL | 0 refills | Status: DC | PRN

## 2019-06-20 ENCOUNTER — Other Ambulatory Visit: Payer: Self-pay

## 2019-06-20 MED ORDER — HYDROCODONE-ACETAMINOPHEN 7.5-325 MG PO TABS: 1.0000 | ORAL_TABLET | Freq: Four times a day (QID) | ORAL | 0 refills | Status: DC | PRN

## 2019-07-15 ENCOUNTER — Other Ambulatory Visit: Payer: Self-pay | Admitting: Orthopaedic Surgery

## 2019-07-18 MED ORDER — HYDROCODONE-ACETAMINOPHEN 7.5-325 MG PO TABS: 1.0000 | ORAL_TABLET | Freq: Four times a day (QID) | ORAL | 0 refills | Status: DC | PRN

## 2019-08-15 ENCOUNTER — Other Ambulatory Visit: Payer: Self-pay | Admitting: Orthopaedic Surgery

## 2019-08-16 ENCOUNTER — Ambulatory Visit: Payer: BC Managed Care – PPO | Admitting: Orthopaedic Surgery

## 2019-08-16 ENCOUNTER — Other Ambulatory Visit: Payer: Self-pay

## 2019-08-16 ENCOUNTER — Encounter: Payer: Self-pay | Admitting: Orthopaedic Surgery

## 2019-08-16 VITALS — BP 142/79 | HR 67 | Temp 97.3°F | Ht 67.0 in | Wt 261.0 lb

## 2019-08-16 DIAGNOSIS — G8929 Other chronic pain: Secondary | ICD-10-CM

## 2019-08-16 DIAGNOSIS — M545 Low back pain: Secondary | ICD-10-CM

## 2019-08-16 MED ORDER — HYDROCODONE-ACETAMINOPHEN 7.5-325 MG PO TABS: 1.0000 | ORAL_TABLET | Freq: Four times a day (QID) | ORAL | 0 refills | Status: DC | PRN

## 2019-08-16 NOTE — Progress Notes (Signed)
Patient Wendy Ramirez, female DOB:05-21-1966, 54 y.o. [id number]  Chief Complaint  Patient presents with  . Back Problem    followup LBP    HPI  Wendy Ramirez is a 54 y.o. female who has chronic lower back pain.  She is better.  She has lost 18 more pounds.  She still has pain with cold days and increased activity but a lot of the pain is gone.  She has no new trauma.   Body mass index is 40.88 kg/m.  ROS  Review of Systems  HENT: Negative for congestion.   Respiratory: Positive for shortness of breath. Negative for cough.   Musculoskeletal: Positive for arthralgias, back pain and myalgias.  All other systems reviewed and are negative.   All other systems reviewed and are negative.  The following is a summary of the past history medically, past history surgically, known current medicines, social history and family history.  This information is gathered electronically by the computer from prior information and documentation.  I review this each visit and have found including this information at this point in the chart is beneficial and informative.    Past Medical History:  Diagnosis Date  . Atrial fibrillation ([facility])   . Back pain   . Diverticulitis   . Pneumonia   . SVT (supraventricular tachycardia) ([facility])     Past Surgical History:  Procedure Laterality Date  . CHOLECYSTECTOMY    . TUBAL LIGATION      Family History  Problem Relation Age of Onset  . Heart disease Father   . Atrial fibrillation Maternal Grandmother   . Heart attack Paternal Grandfather     Social History Social History   Tobacco Use  . Smoking status: Current Every Day Smoker    Packs/day: 1.00    Types: Cigarettes    Start date: 06/19/1993  . Smokeless tobacco: Never Used  Substance Use Topics  . Alcohol use: No  . Drug use: No    No Known Allergies  Current Outpatient Medications  Medication Sig Dispense Refill  . HYDROcodone-acetaminophen (NORCO) 7.5-325 MG tablet Take  1 tablet by mouth every 6 (six) hours as needed for moderate pain (Must last 30 days). 45 tablet 0  . metoprolol tartrate (LOPRESSOR) 100 MG tablet Take 1 tablet (100 mg total) by mouth 2 (two) times daily. 180 tablet 3  . chlorthalidone (HYGROTON) 25 MG tablet Take 1 tablet (25 mg total) by mouth daily. 30 tablet 3  . naproxen (NAPROSYN) 500 MG tablet Take 1 tablet (500 mg total) by mouth 2 (two) times daily with a meal. (Patient not taking: Reported on 08/16/2019) 60 tablet 5  . nitrofurantoin, macrocrystal-monohydrate, (MACROBID) 100 MG capsule Take 1 capsule (100 mg total) by mouth 2 (two) times daily. (Patient not taking: Reported on 08/16/2019) 10 capsule 0  . phenazopyridine (PYRIDIUM) 200 MG tablet Take 1 tablet (200 mg total) by mouth 3 (three) times daily. (Patient not taking: Reported on 08/16/2019) 6 tablet 0   No current facility-administered medications for this visit.     Physical Exam  Blood pressure (!) 142/79, pulse 67, temperature (!) 97.3 F (36.3 C), height 5\' 7"  (1.702 m), weight 261 lb (118.4 kg), last menstrual period 05/23/2013.  Constitutional: overall normal hygiene, normal nutrition, well developed, normal grooming, normal body habitus. Assistive device:none  Musculoskeletal: gait and station Limp none, muscle tone and strength are normal, no tremors or atrophy is present.  .  Neurological: coordination overall normal.  Deep tendon reflex/nerve stretch  intact.  Sensation normal.  Cranial nerves II-XII intact.   Skin:   Normal overall no scars, lesions, ulcers or rashes. No psoriasis.  Psychiatric: Alert and oriented x 3.  Recent memory intact, remote memory unclear.  Normal mood and affect. Well groomed.  Good eye contact.  Cardiovascular: overall no swelling, no varicosities, no edema bilaterally, normal temperatures of the legs and arms, no clubbing, cyanosis and good capillary refill.  Lymphatic: palpation is normal.  Spine/Pelvis examination:  Inspection:   Overall, sacoiliac joint benign and hips nontender; without crepitus or defects.   Thoracic spine inspection: Alignment normal without kyphosis present   Lumbar spine inspection:  Alignment  with normal lumbar lordosis, without scoliosis apparent.   Thoracic spine palpation:  without tenderness of spinal processes   Lumbar spine palpation: without tenderness of lumbar area; without tightness of lumbar muscles    Range of Motion:   Lumbar flexion, forward flexion is normal without pain or tenderness    Lumbar extension is full without pain or tenderness   Left lateral bend is normal without pain or tenderness   Right lateral bend is normal without pain or tenderness   Straight leg raising is normal  Strength & tone: normal   Stability overall normal stability  All other systems reviewed and are negative   The patient has been educated about the nature of the problem(s) and counseled on treatment options.  The patient appeared to understand what I have discussed and is in agreement with it.  Encounter Diagnosis  Name Primary?  . Chronic midline low back pain without sciatica Yes    PLAN Call if any problems.  Precautions discussed.  Continue current medications.   Return to clinic 3 months   I have reviewed the [place] web site prior to prescribing narcotic medicine for this patient.   Electronically Signed Sanjuana Kava, MD 3/23/20213:10 PM

## 2019-08-30 ENCOUNTER — Ambulatory Visit: Payer: BC Managed Care – PPO | Admitting: Orthopaedic Surgery

## 2019-09-13 ENCOUNTER — Other Ambulatory Visit: Payer: Self-pay | Admitting: Orthopaedic Surgery

## 2019-09-13 MED ORDER — HYDROCODONE-ACETAMINOPHEN 7.5-325 MG PO TABS: 1.0000 | ORAL_TABLET | Freq: Four times a day (QID) | ORAL | 0 refills | Status: DC | PRN

## 2019-10-10 ENCOUNTER — Other Ambulatory Visit: Payer: Self-pay | Admitting: Orthopaedic Surgery

## 2019-10-11 MED ORDER — HYDROCODONE-ACETAMINOPHEN 7.5-325 MG PO TABS: 1.0000 | ORAL_TABLET | Freq: Four times a day (QID) | ORAL | 0 refills | Status: DC | PRN

## 2019-11-15 ENCOUNTER — Ambulatory Visit: Payer: BC Managed Care – PPO | Admitting: Orthopaedic Surgery

## 2019-11-15 ENCOUNTER — Other Ambulatory Visit: Payer: Self-pay

## 2019-11-15 ENCOUNTER — Encounter: Payer: Self-pay | Admitting: Orthopaedic Surgery

## 2019-11-15 VITALS — BP 167/87 | HR 66 | Ht 67.0 in | Wt 253.0 lb

## 2019-11-15 DIAGNOSIS — M545 Low back pain, unspecified: Secondary | ICD-10-CM

## 2019-11-15 DIAGNOSIS — G8929 Other chronic pain: Secondary | ICD-10-CM | POA: Diagnosis not present

## 2019-11-15 DIAGNOSIS — F17210 Nicotine dependence, cigarettes, uncomplicated: Secondary | ICD-10-CM

## 2019-11-15 MED ORDER — CELECOXIB 200 MG PO CAPS: ORAL_CAPSULE | ORAL | 5 refills | Status: DC

## 2019-11-15 MED ORDER — HYDROCODONE-ACETAMINOPHEN 7.5-325 MG PO TABS: 1.0000 | ORAL_TABLET | Freq: Four times a day (QID) | ORAL | 0 refills | Status: DC | PRN

## 2019-11-15 NOTE — Progress Notes (Signed)
Patient YN:[id number] Wendy Ramirez, female DOB:08-07-1965, 54 y.o. HYQ:[id number]  Chief Complaint  Patient presents with  . Back Pain  . Medication Refill    HPI  Wendy Ramirez is a 54 y.o. female who has lower back pain chronically.  She has no weakness, no new trauma.  She has been active and taking her medicine.  She has some bad days.  I will begin Celebrex again now that she has insurance.   Body mass index is 39.63 kg/m.  ROS  Review of Systems  HENT: Negative for congestion.   Respiratory: Positive for shortness of breath. Negative for cough.   Musculoskeletal: Positive for arthralgias, back pain and myalgias.  All other systems reviewed and are negative.   All other systems reviewed and are negative.  The following is a summary of the past history medically, past history surgically, known current medicines, social history and family history.  This information is gathered electronically by the computer from prior information and documentation.  I review this each visit and have found including this information at this point in the chart is beneficial and informative.    Past Medical History:  Diagnosis Date  . Atrial fibrillation ([place])   . Back pain   . Diverticulitis   . Pneumonia   . SVT (supraventricular tachycardia) ([facility])     Past Surgical History:  Procedure Laterality Date  . CHOLECYSTECTOMY    . TUBAL LIGATION      Family History  Problem Relation Age of Onset  . Heart disease Father   . Atrial fibrillation Maternal Grandmother   . Heart attack Paternal Grandfather     Social History Social History   Tobacco Use  . Smoking status: Current Every Day Smoker    Packs/day: 1.00    Types: Cigarettes    Start date: 06/19/1993  . Smokeless tobacco: Never Used  Vaping Use  . Vaping Use: Every day  . Start date: 10/17/2013  Substance Use Topics  . Alcohol use: No  . Drug use: No    No Known Allergies  Current Outpatient Medications  Medication Sig  Dispense Refill  . celecoxib (CELEBREX) 200 MG capsule One tablet once a day by mouth after eating. 30 capsule 5  . chlorthalidone (HYGROTON) 25 MG tablet Take 1 tablet (25 mg total) by mouth daily. 30 tablet 3  . HYDROcodone-acetaminophen (NORCO) 7.5-325 MG tablet Take 1 tablet by mouth every 6 (six) hours as needed for moderate pain (Must last 30 days). 45 tablet 0  . metoprolol tartrate (LOPRESSOR) 100 MG tablet Take 1 tablet (100 mg total) by mouth 2 (two) times daily. 180 tablet 3  . naproxen (NAPROSYN) 500 MG tablet Take 1 tablet (500 mg total) by mouth 2 (two) times daily with a meal. (Patient not taking: Reported on 08/16/2019) 60 tablet 5  . nitrofurantoin, macrocrystal-monohydrate, (MACROBID) 100 MG capsule Take 1 capsule (100 mg total) by mouth 2 (two) times daily. (Patient not taking: Reported on 08/16/2019) 10 capsule 0  . phenazopyridine (PYRIDIUM) 200 MG tablet Take 1 tablet (200 mg total) by mouth 3 (three) times daily. (Patient not taking: Reported on 08/16/2019) 6 tablet 0   No current facility-administered medications for this visit.     Physical Exam  Blood pressure (!) 167/87, pulse 66, height 5\' 7"  (1.702 m), weight 253 lb (114.8 kg), last menstrual period 05/23/2013.  Constitutional: overall normal hygiene, normal nutrition, well developed, normal grooming, normal body habitus. Assistive device:none  Musculoskeletal: gait and station Limp none,  muscle tone and strength are normal, no tremors or atrophy is present.  .  Neurological: coordination overall normal.  Deep tendon reflex/nerve stretch intact.  Sensation normal.  Cranial nerves II-XII intact.   Skin:   Normal overall no scars, lesions, ulcers or rashes. No psoriasis.  Psychiatric: Alert and oriented x 3.  Recent memory intact, remote memory unclear.  Normal mood and affect. Well groomed.  Good eye contact.  Cardiovascular: overall no swelling, no varicosities, no edema bilaterally, normal temperatures of the  legs and arms, no clubbing, cyanosis and good capillary refill.  Lymphatic: palpation is normal.  All other systems reviewed and are negative   The patient has been educated about the nature of the problem(s) and counseled on treatment options.  The patient appeared to understand what I have discussed and is in agreement with it.  Encounter Diagnoses  Name Primary?  . Chronic midline low back pain without sciatica Yes  . Cigarette nicotine dependence without complication    She has lost weight and is below BMI of 40.  Very good.  PLAN Call if any problems.  Precautions discussed.  Continue current medications. Begin Celebrex daily.   Return to clinic 3 months   I have reviewed the [facility] Controlled Substance Reporting System web site prior to prescribing narcotic medicine for this patient.   Electronically Signed Darreld Mclean, MD 6/22/20212:22 PM

## 2019-12-13 ENCOUNTER — Other Ambulatory Visit: Payer: Self-pay | Admitting: Orthopaedic Surgery

## 2019-12-13 MED ORDER — HYDROCODONE-ACETAMINOPHEN 7.5-325 MG PO TABS: 1.0000 | ORAL_TABLET | Freq: Four times a day (QID) | ORAL | 0 refills | Status: DC | PRN

## 2020-01-11 ENCOUNTER — Other Ambulatory Visit: Payer: Self-pay | Admitting: Orthopaedic Surgery

## 2020-01-12 MED ORDER — HYDROCODONE-ACETAMINOPHEN 7.5-325 MG PO TABS: 1.0000 | ORAL_TABLET | Freq: Four times a day (QID) | ORAL | 0 refills | Status: DC | PRN

## 2020-02-09 ENCOUNTER — Other Ambulatory Visit: Payer: Self-pay | Admitting: Orthopaedic Surgery

## 2020-02-10 MED ORDER — HYDROCODONE-ACETAMINOPHEN 7.5-325 MG PO TABS: 1.0000 | ORAL_TABLET | Freq: Four times a day (QID) | ORAL | 0 refills | Status: DC | PRN

## 2020-02-23 IMAGING — DX DG CHEST 2V
2 series · 2 of 2 positions shown · non-contrast
Comparison: Radiograph 06/18/2016

CLINICAL DATA: Chest pain. Chest tightness today.

EXAM:
CHEST - 2 VIEW

[chest pa]
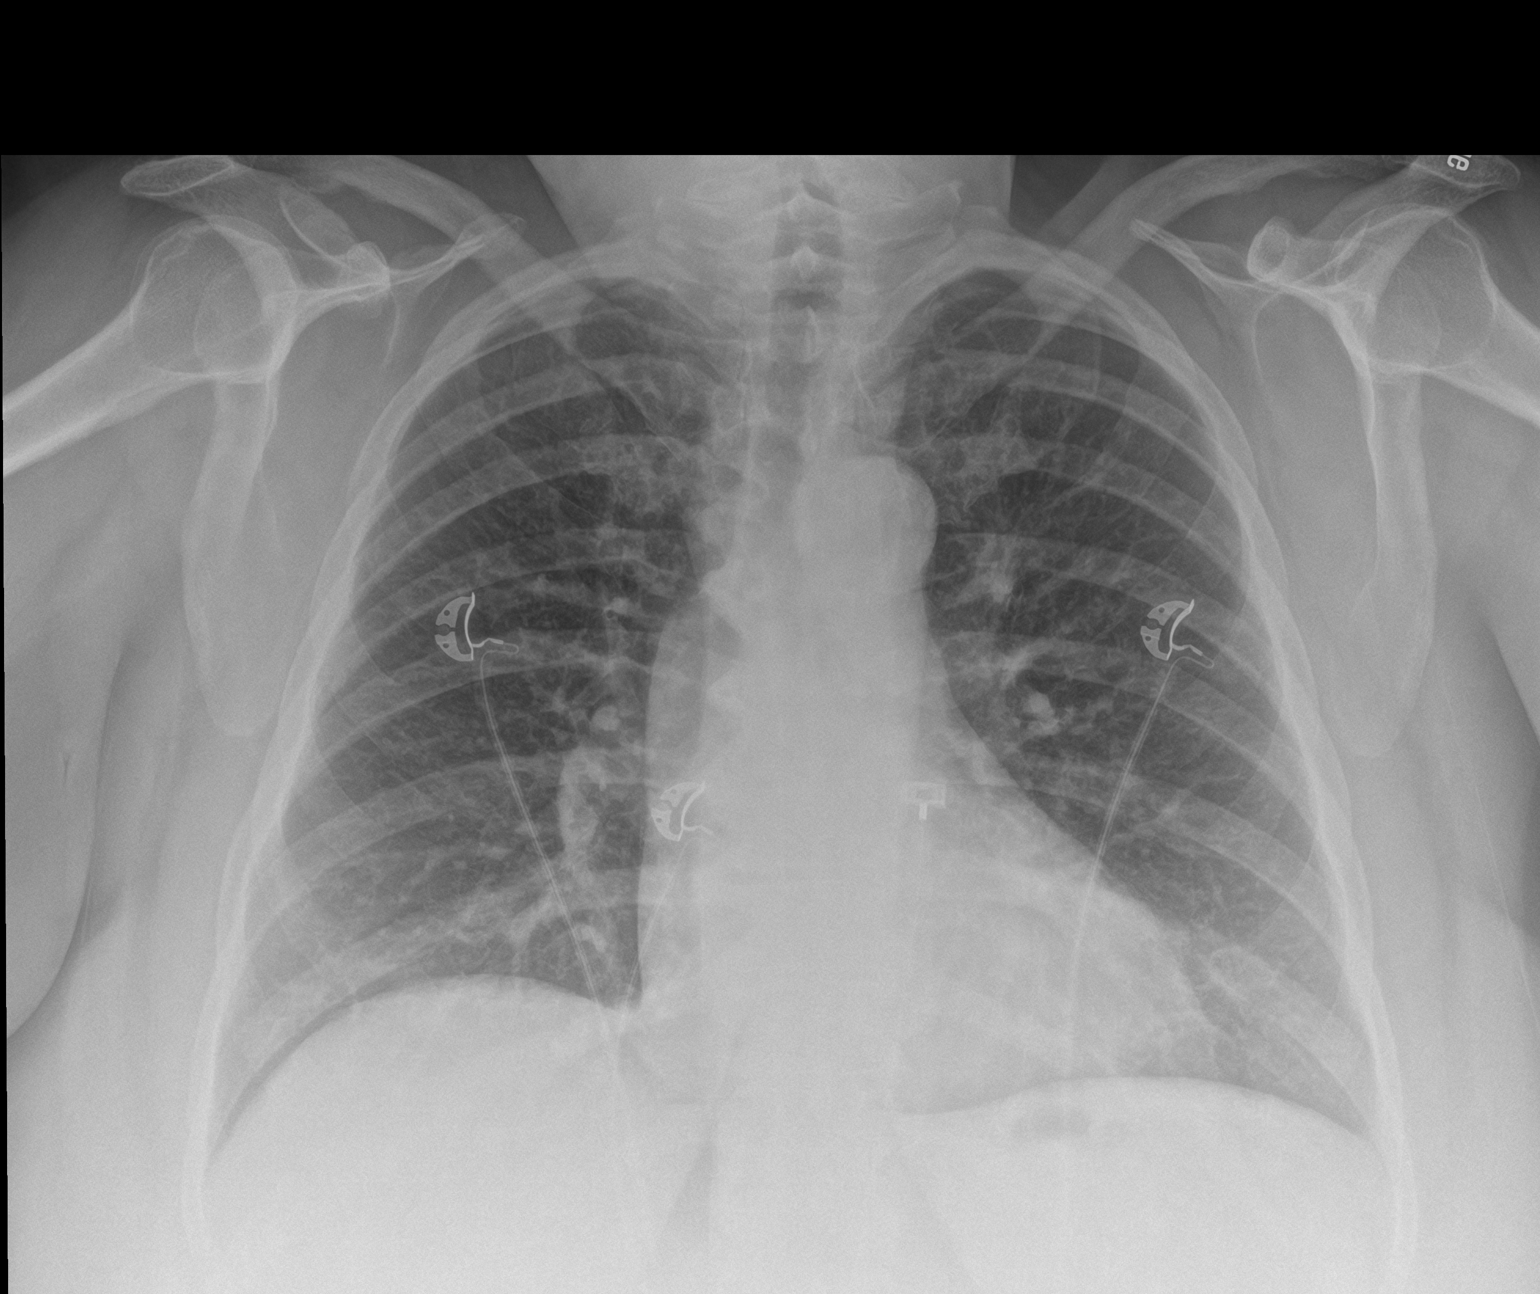

[chest lat]
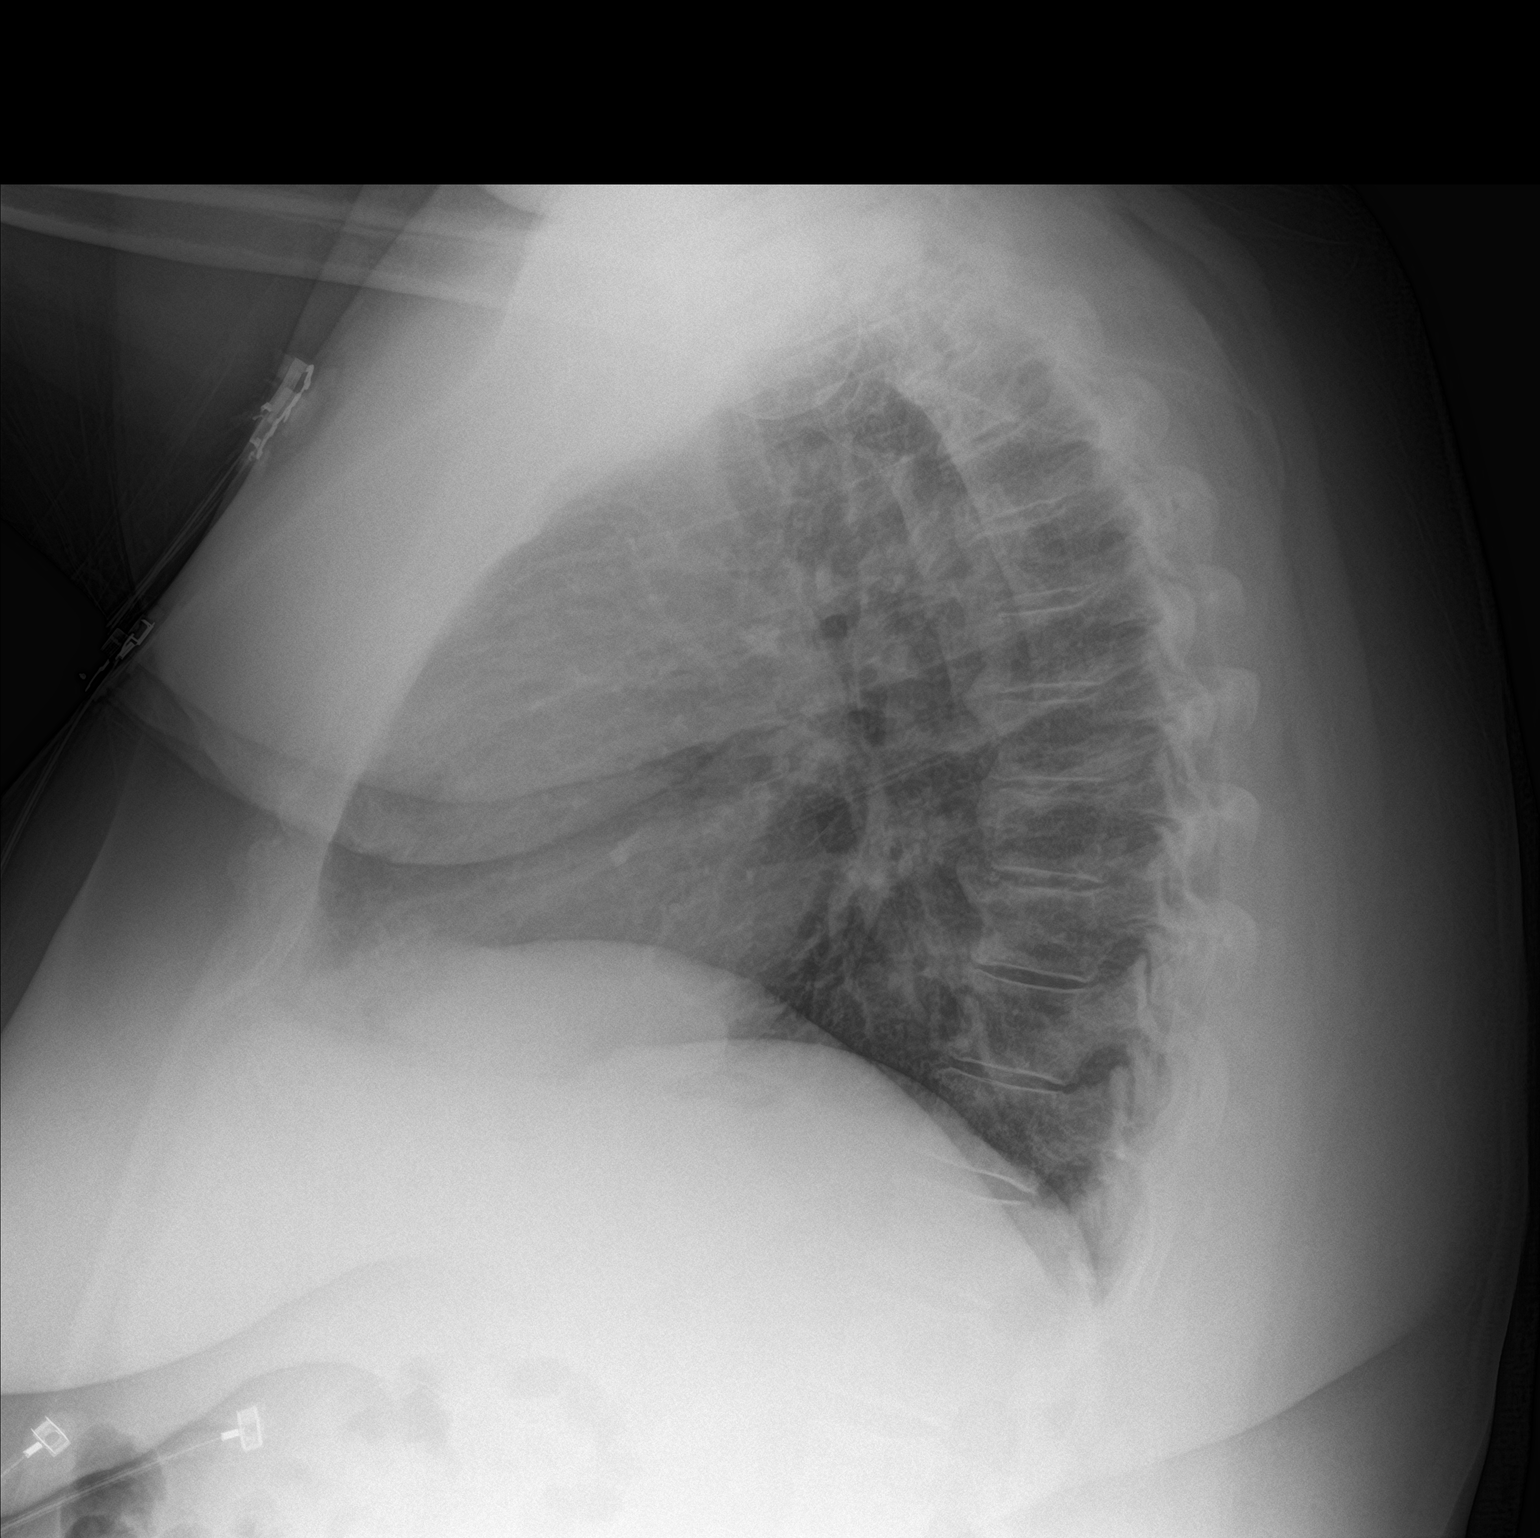

[2 of 2 positions shown; findings below may reference images not displayed]

FINDINGS: The cardiomediastinal contours are normal. Slight bronchial
thickening. Pulmonary vasculature is normal. No consolidation,
pleural effusion, or pneumothorax. No acute osseous abnormalities
are seen.
IMPRESSION: Slight bronchial thickening, can be seen with bronchitis or asthma.
Otherwise unremarkable radiographs of the chest.

## 2020-03-11 ENCOUNTER — Other Ambulatory Visit: Payer: Self-pay | Admitting: Cardiology

## 2020-03-12 ENCOUNTER — Other Ambulatory Visit: Payer: Self-pay | Admitting: Orthopaedic Surgery

## 2020-03-12 MED ORDER — HYDROCODONE-ACETAMINOPHEN 7.5-325 MG PO TABS: 1.0000 | ORAL_TABLET | Freq: Four times a day (QID) | ORAL | 0 refills | Status: DC | PRN

## 2020-03-13 ENCOUNTER — Other Ambulatory Visit: Payer: Self-pay

## 2020-03-13 ENCOUNTER — Ambulatory Visit: Payer: BC Managed Care – PPO | Admitting: Orthopaedic Surgery

## 2020-03-13 ENCOUNTER — Encounter: Payer: Self-pay | Admitting: Orthopaedic Surgery

## 2020-03-13 VITALS — BP 174/91 | HR 78 | Ht 67.0 in | Wt 240.0 lb

## 2020-03-13 DIAGNOSIS — G8929 Other chronic pain: Secondary | ICD-10-CM

## 2020-03-13 DIAGNOSIS — F17210 Nicotine dependence, cigarettes, uncomplicated: Secondary | ICD-10-CM

## 2020-03-13 DIAGNOSIS — M545 Low back pain, unspecified: Secondary | ICD-10-CM | POA: Diagnosis not present

## 2020-03-13 NOTE — Progress Notes (Signed)
Patient Wendy Ramirez, female DOB:July 31, 1965, 54 y.o. HCW:[id number]  Chief Complaint  Patient presents with  . Back Pain    HPI  Wendy Ramirez is a 54 y.o. female who has chronic lower back pain.  She is taking her medicine and doing her exercises.  She has had more pain recently with the cold weather.  She has no new trauma, no weakness.   Body mass index is 37.59 kg/m.  ROS  Review of Systems  HENT: Negative for congestion.   Respiratory: Positive for shortness of breath. Negative for cough.   Musculoskeletal: Positive for arthralgias, back pain and myalgias.  All other systems reviewed and are negative.   All other systems reviewed and are negative.  The following is a summary of the past history medically, past history surgically, known current medicines, social history and family history.  This information is gathered electronically by the computer from prior information and documentation.  I review this each visit and have found including this information at this point in the chart is beneficial and informative.    Past Medical History:  Diagnosis Date  . Atrial fibrillation ([facility])   . Back pain   . Diverticulitis   . Pneumonia   . SVT (supraventricular tachycardia) ([facility])     Past Surgical History:  Procedure Laterality Date  . CHOLECYSTECTOMY    . TUBAL LIGATION      Family History  Problem Relation Age of Onset  . Heart disease Father   . Atrial fibrillation Maternal Grandmother   . Heart attack Paternal Grandfather     Social History Social History   Tobacco Use  . Smoking status: Current Every Day Smoker    Packs/day: 1.00    Types: Cigarettes    Start date: 06/19/1993  . Smokeless tobacco: Never Used  Vaping Use  . Vaping Use: Every day  . Start date: 10/17/2013  Substance Use Topics  . Alcohol use: No  . Drug use: No    No Known Allergies  Current Outpatient Medications  Medication Sig Dispense Refill  . celecoxib (CELEBREX) 200  MG capsule One tablet once a day by mouth after eating. 30 capsule 5  . HYDROcodone-acetaminophen (NORCO) 7.5-325 MG tablet Take 1 tablet by mouth every 6 (six) hours as needed for moderate pain (Must last 30 days). 45 tablet 0  . metoprolol tartrate (LOPRESSOR) 100 MG tablet Take 1 tablet (100 mg total) by mouth 2 (two) times daily. Please call to schedule appointment for future refills. 60 tablet 0   No current facility-administered medications for this visit.     Physical Exam  Blood pressure (!) 174/91, pulse 78, height 5\' 7"  (1.702 m), weight 240 lb (108.9 kg), last menstrual period 05/23/2013.  Constitutional: overall normal hygiene, normal nutrition, well developed, normal grooming, normal body habitus. Assistive device:none  Musculoskeletal: gait and station Limp none, muscle tone and strength are normal, no tremors or atrophy is present.  .  Neurological: coordination overall normal.  Deep tendon reflex/nerve stretch intact.  Sensation normal.  Cranial nerves II-XII intact.   Skin:   Normal overall no scars, lesions, ulcers or rashes. No psoriasis.  Psychiatric: Alert and oriented x 3.  Recent memory intact, remote memory unclear.  Normal mood and affect. Well groomed.  Good eye contact.  Cardiovascular: overall no swelling, no varicosities, no edema bilaterally, normal temperatures of the legs and arms, no clubbing, cyanosis and good capillary refill.  Lymphatic: palpation is normal.  Spine/Pelvis examination:  Inspection:  Overall, sacoiliac joint benign and hips nontender; without crepitus or defects.   Thoracic spine inspection: Alignment normal without kyphosis present   Lumbar spine inspection:  Alignment  with normal lumbar lordosis, without scoliosis apparent.   Thoracic spine palpation:  without tenderness of spinal processes   Lumbar spine palpation: without tenderness of lumbar area; without tightness of lumbar muscles    Range of Motion:   Lumbar flexion,  forward flexion is normal without pain or tenderness    Lumbar extension is full without pain or tenderness   Left lateral bend is normal without pain or tenderness   Right lateral bend is normal without pain or tenderness   Straight leg raising is normal  Strength & tone: normal   Stability overall normal stability  All other systems reviewed and are negative   The patient has been educated about the nature of the problem(s) and counseled on treatment options.  The patient appeared to understand what I have discussed and is in agreement with it.  Encounter Diagnoses  Name Primary?  . Chronic midline low back pain without sciatica Yes  . Cigarette nicotine dependence without complication     PLAN Call if any problems.  Precautions discussed.  Continue current medications.   Return to clinic 3 months   I called her pain medicine in yesterday.  Electronically Signed Darreld Mclean, MD 10/19/20212:28 PM

## 2020-04-09 ENCOUNTER — Other Ambulatory Visit: Payer: Self-pay | Admitting: Cardiology

## 2020-04-11 ENCOUNTER — Other Ambulatory Visit: Payer: Self-pay | Admitting: Orthopaedic Surgery

## 2020-04-12 MED ORDER — HYDROCODONE-ACETAMINOPHEN 7.5-325 MG PO TABS: 1.0000 | ORAL_TABLET | Freq: Four times a day (QID) | ORAL | 0 refills | Status: DC | PRN

## 2020-04-16 ENCOUNTER — Telehealth: Payer: Self-pay | Admitting: Cardiology

## 2020-04-16 ENCOUNTER — Other Ambulatory Visit: Payer: Self-pay

## 2020-04-16 MED ORDER — METOPROLOL TARTRATE 100 MG PO TABS
100.0000 mg | ORAL_TABLET | Freq: Two times a day (BID) | ORAL | 0 refills | Status: DC
Start: 2020-04-16 — End: 2020-06-28

## 2020-04-16 NOTE — Telephone Encounter (Signed)
Refilled Lopressor 100 mg BID, #180 RF:0 to [facility]

## 2020-04-16 NOTE — Telephone Encounter (Signed)
Patient states she needs refill on her generic Lopressor 100mg  BID.  Please call to [facility] in [place].

## 2020-04-16 NOTE — Telephone Encounter (Addendum)
Per 10/10/2016 office note, patient was to return in 6 months.   She needs an apt with Dr.Branch.    Update: 90 day supply lopressor given to patient (has apt 06/30/19 with Dr.Branch)

## 2020-05-09 ENCOUNTER — Other Ambulatory Visit: Payer: Self-pay | Admitting: Orthopaedic Surgery

## 2020-05-09 MED ORDER — HYDROCODONE-ACETAMINOPHEN 7.5-325 MG PO TABS: 1.0000 | ORAL_TABLET | Freq: Four times a day (QID) | ORAL | 0 refills | Status: DC | PRN

## 2020-05-11 ENCOUNTER — Other Ambulatory Visit: Payer: Self-pay | Admitting: Orthopaedic Surgery

## 2020-06-12 ENCOUNTER — Ambulatory Visit (INDEPENDENT_AMBULATORY_CARE_PROVIDER_SITE_OTHER): Payer: BC Managed Care – PPO | Admitting: Orthopaedic Surgery

## 2020-06-12 ENCOUNTER — Other Ambulatory Visit: Payer: Self-pay

## 2020-06-12 ENCOUNTER — Encounter: Payer: Self-pay | Admitting: Orthopaedic Surgery

## 2020-06-12 VITALS — BP 198/104 | HR 72 | Ht 67.0 in | Wt 250.0 lb

## 2020-06-12 DIAGNOSIS — M545 Low back pain, unspecified: Secondary | ICD-10-CM

## 2020-06-12 DIAGNOSIS — G8929 Other chronic pain: Secondary | ICD-10-CM | POA: Diagnosis not present

## 2020-06-12 DIAGNOSIS — F17210 Nicotine dependence, cigarettes, uncomplicated: Secondary | ICD-10-CM

## 2020-06-12 MED ORDER — HYDROCODONE-ACETAMINOPHEN 7.5-325 MG PO TABS: 1.0000 | ORAL_TABLET | Freq: Four times a day (QID) | ORAL | 0 refills | Status: DC | PRN

## 2020-06-12 NOTE — Patient Instructions (Signed)

## 2020-06-12 NOTE — Progress Notes (Signed)
Patient Wendy Ramirez, female DOB:[birth year], 55 y.o. [id number]  Chief Complaint  Patient presents with  . Back Pain    Hurting about the same.    HPI  Wendy Ramirez is a 55 y.o. female who has lower back pain.  The cold weather and the snow two days ago has made it worse.  She is taking her medicine and doing her exercises.  She has lost some weight also.   Body mass index is 39.16 kg/m.  ROS  Review of Systems  HENT: Negative for congestion.   Respiratory: Positive for shortness of breath. Negative for cough.   Musculoskeletal: Positive for arthralgias, back pain and myalgias.  All other systems reviewed and are negative.   All other systems reviewed and are negative.  The following is a summary of the past history medically, past history surgically, known current medicines, social history and family history.  This information is gathered electronically by the computer from prior information and documentation.  I review this each visit and have found including this information at this point in the chart is beneficial and informative.    Past Medical History:  Diagnosis Date  . Atrial fibrillation ([facility])   . Back pain   . Diverticulitis   . Pneumonia   . SVT (supraventricular tachycardia) ([facility])     Past Surgical History:  Procedure Laterality Date  . CHOLECYSTECTOMY    . TUBAL LIGATION      Family History  Problem Relation Age of Onset  . Heart disease Father   . Atrial fibrillation Maternal Grandmother   . Heart attack Paternal Grandfather     Social History Social History   Tobacco Use  . Smoking status: Current Every Day Smoker    Packs/day: 1.00    Types: Cigarettes    Start date: 06/19/1993  . Smokeless tobacco: Never Used  Vaping Use  . Vaping Use: Every day  . Start date: 10/17/2013  Substance Use Topics  . Alcohol use: No  . Drug use: No    No Known Allergies  Current Outpatient Medications  Medication Sig Dispense Refill  .  celecoxib (CELEBREX) 200 MG capsule TAKE 1 CAPSULE BY MOUTH EVERY DAY AFTER A MEAL 30 capsule 5  . HYDROcodone-acetaminophen (NORCO) 7.5-325 MG tablet Take 1 tablet by mouth every 6 (six) hours as needed for moderate pain (Must last 30 days). 45 tablet 0  . metoprolol tartrate (LOPRESSOR) 100 MG tablet Take 1 tablet (100 mg total) by mouth 2 (two) times daily. Please call to schedule appointment for future refills. 180 tablet 0   No current facility-administered medications for this visit.     Physical Exam  Blood pressure (!) 198/104, pulse 72, height 5\' 7"  (1.702 m), weight 250 lb (113.4 kg), last menstrual period 05/23/2013.  Constitutional: overall normal hygiene, normal nutrition, well developed, normal grooming, normal body habitus. Assistive device:none  Musculoskeletal: gait and station Limp none, muscle tone and strength are normal, no tremors or atrophy is present.  .  Neurological: coordination overall normal.  Deep tendon reflex/nerve stretch intact.  Sensation normal.  Cranial nerves II-XII intact.   Skin:   Normal overall no scars, lesions, ulcers or rashes. No psoriasis.  Psychiatric: Alert and oriented x 3.  Recent memory intact, remote memory unclear.  Normal mood and affect. Well groomed.  Good eye contact.  Cardiovascular: overall no swelling, no varicosities, no edema bilaterally, normal temperatures of the legs and arms, no clubbing, cyanosis and good capillary refill.  Lymphatic:  palpation is normal.  Spine/Pelvis examination:  Inspection:  Overall, sacoiliac joint benign and hips nontender; without crepitus or defects.   Thoracic spine inspection: Alignment normal without kyphosis present   Lumbar spine inspection:  Alignment  with normal lumbar lordosis, without scoliosis apparent.   Thoracic spine palpation:  without tenderness of spinal processes   Lumbar spine palpation: without tenderness of lumbar area; without tightness of lumbar muscles    Range of  Motion:   Lumbar flexion, forward flexion is normal without pain or tenderness    Lumbar extension is full without pain or tenderness   Left lateral bend is normal without pain or tenderness   Right lateral bend is normal without pain or tenderness   Straight leg raising is normal  Strength & tone: normal   Stability overall normal stability  All other systems reviewed and are negative   The patient has been educated about the nature of the problem(s) and counseled on treatment options.  The patient appeared to understand what I have discussed and is in agreement with it.  Encounter Diagnoses  Name Primary?  . Chronic midline low back pain without sciatica Yes  . Cigarette nicotine dependence without complication     PLAN Call if any problems.  Precautions discussed.  Continue current medications.   Return to clinic 3 months   I have reviewed the [facility] Controlled Substance Reporting System web site prior to prescribing narcotic medicine for this patient.   Electronically Signed Darreld Mclean, MD 1/18/20222:12 PM

## 2020-06-28 ENCOUNTER — Other Ambulatory Visit: Payer: Self-pay

## 2020-06-28 ENCOUNTER — Telehealth: Payer: Self-pay | Admitting: Cardiology

## 2020-06-28 MED ORDER — METOPROLOL TARTRATE 100 MG PO TABS
100.0000 mg | ORAL_TABLET | Freq: Two times a day (BID) | ORAL | 0 refills | Status: DC
Start: 2020-06-28 — End: 2020-07-13

## 2020-06-28 NOTE — Telephone Encounter (Signed)
New message        COVID-19 Pre-Screening Questions V2.:  . In the past 7 to 10 days have you had a cough,  shortness of breath, headache, congestion, fever (100 or greater) body aches, chills, sore throat, or sudden loss of taste or sense of smell,? . Have you been around anyone with known Covid 19, or who is waiting for a Covid test result and is symptomatic?  Patients husband tested positive for covid , she does not have any symptoms, she will be out of her medication in 2 weeks , do you want her to reschedule or be seen ?    For patients who are Covid+, pending results or exposed in the last 5 days WITH SYMPTOMS:  1.       Offer to change appointment to a [place] appointment. If the patient declines, contact covering nurse or triage so it can be determined if patient needs to be seen or     rescheduled. (assumes patient calls ahead)  2.       If a patient presents in the lobby, ask them to return to their car and the nurse will call them. Obtain the correct cell phone number and message the covering nurse. The nurse will triage the patient and discuss with the provider to determine appropriate visit plan (In office, MyChart or reschedule).   3.       If it is determined the patient needs to be seen in the office, arrangements will be made for the patient to be seen in a remote room with minimal touches. (Location will be         specific to each [facility] site).               If you have any concerns/questions about symptoms patients report during screening (either on the phone or at threshold),                Send a secure chat with the patient's chart to the APP doing preop clearances for that day.              If the decision is made to see the patient, document the following in the appt. note:  "cleared by APP's initials".                        If an APP is not available contact a member of the leadership team.   Patients who are Covid+ are allowed in the office when the  following are met: 1. No symptoms or improving symptoms  2. At least 5 days post positive test

## 2020-06-28 NOTE — Telephone Encounter (Signed)
Medication refill request approved.

## 2020-06-28 NOTE — Telephone Encounter (Signed)
Patient has been rescheduled for August 21, 2020

## 2020-06-29 ENCOUNTER — Ambulatory Visit: Payer: BC Managed Care – PPO | Admitting: Cardiology

## 2020-07-11 ENCOUNTER — Telehealth: Payer: Self-pay | Admitting: Orthopaedic Surgery

## 2020-07-12 MED ORDER — HYDROCODONE-ACETAMINOPHEN 7.5-325 MG PO TABS: 1.0000 | ORAL_TABLET | Freq: Four times a day (QID) | ORAL | 0 refills | Status: DC | PRN

## 2020-07-13 ENCOUNTER — Other Ambulatory Visit: Payer: Self-pay | Admitting: Cardiology

## 2020-08-09 ENCOUNTER — Telehealth: Payer: Self-pay | Admitting: Orthopaedic Surgery

## 2020-08-09 MED ORDER — HYDROCODONE-ACETAMINOPHEN 7.5-325 MG PO TABS
1.0000 | ORAL_TABLET | Freq: Four times a day (QID) | ORAL | 0 refills | Status: DC | PRN
Start: 2020-08-09 — End: 2020-09-11

## 2020-08-20 ENCOUNTER — Encounter: Payer: Self-pay | Admitting: *Deleted

## 2020-08-21 ENCOUNTER — Encounter: Payer: Self-pay | Admitting: Cardiology

## 2020-08-21 ENCOUNTER — Other Ambulatory Visit: Payer: Self-pay

## 2020-08-21 ENCOUNTER — Ambulatory Visit (INDEPENDENT_AMBULATORY_CARE_PROVIDER_SITE_OTHER): Payer: BC Managed Care – PPO | Admitting: Cardiology

## 2020-08-21 VITALS — BP 174/100 | HR 70 | Ht 67.0 in | Wt 257.0 lb

## 2020-08-21 DIAGNOSIS — Z13220 Encounter for screening for lipoid disorders: Secondary | ICD-10-CM

## 2020-08-21 DIAGNOSIS — Z1329 Encounter for screening for other suspected endocrine disorder: Secondary | ICD-10-CM

## 2020-08-21 DIAGNOSIS — I4891 Unspecified atrial fibrillation: Secondary | ICD-10-CM | POA: Diagnosis not present

## 2020-08-21 DIAGNOSIS — I10 Essential (primary) hypertension: Secondary | ICD-10-CM

## 2020-08-21 DIAGNOSIS — Z79899 Other long term (current) drug therapy: Secondary | ICD-10-CM

## 2020-08-21 DIAGNOSIS — R7309 Other abnormal glucose: Secondary | ICD-10-CM

## 2020-08-21 NOTE — Patient Instructions (Addendum)
Medication Instructions:   Your physician recommends that you continue on your current medications as directed. Please refer to the Current Medication list given to you today.  We are checking on the cost of eliquis 5 mg for you  Labwork: Your physician recommends that you return for a FASTING lipid profile, cmet, cbc, tsh, mg, hga1c. Please do not eat or drink for at least 8 hours when you have this done. You may take your medications that morning with a sip of water.  This may be done at [facility] ([address] [place]) Monday-Friday from 8:00 am - 4:00 pm. No appointment is needed.  Testing/Procedures:  none  Follow-Up:  Your physician recommends that you schedule a follow-up appointment in: 4-6 weeks.  Any Other Special Instructions Will Be Listed Below (If Applicable).  If you need a refill on your cardiac medications before your next appointment, please call your pharmacy.

## 2020-08-21 NOTE — Progress Notes (Signed)
Clinical Summary Wendy Ramirez is a 55 y.o.female seen today for follow up of the following medical problems  1. Paroxysmal Afib - Seen in ER yesterday with chest pain and palpitations.  - she reports high caffeine intake - found to be in afib with RVR in ER. Converted back to NSR after IV cardizem. Toprol XL was increased to 100mg  dialy.   - patient changed from eliquis to coumadin due to cost - around the time of starting coumadin also started norvasc. Noted significant hair loss, stopped both medicines and hair loss resolved. Currently just taking ASA. CHADSVasc score is 2.     2. HTN - does not check bps at home.  - compliantw with meds  3. Hair loss - hair loss started when starting coumadin and norvasc around the same time, she stopped both meds and hair loss resolved.   Past Medical History:  Diagnosis Date  . Atrial fibrillation ([facility])   . Back pain   . Diverticulitis   . Pneumonia   . SVT (supraventricular tachycardia) ([facility])      No Known Allergies   Current Outpatient Medications  Medication Sig Dispense Refill  . HYDROcodone-acetaminophen (NORCO) 7.5-325 MG tablet Take 1 tablet by mouth every 6 (six) hours as needed for moderate pain (Must last 30 days). 45 tablet 0  . celecoxib (CELEBREX) 200 MG capsule TAKE 1 CAPSULE BY MOUTH EVERY DAY AFTER A MEAL 30 capsule 5  . metoprolol tartrate (LOPRESSOR) 100 MG tablet TAKE 1 TABLET(100 MG) BY MOUTH TWICE DAILY 180 tablet 3   No current facility-administered medications for this visit.     Past Surgical History:  Procedure Laterality Date  . CHOLECYSTECTOMY    . TUBAL LIGATION       No Known Allergies    Family History  Problem Relation Age of Onset  . Heart disease Father   . Atrial fibrillation Maternal Grandmother   . Heart attack Paternal Grandfather      Social History Wendy Ramirez reports that she has been smoking cigarettes. She started smoking about 27 years ago. She has been smoking  about 1.00 pack per day. She has never used smokeless tobacco. Wendy Ramirez reports no history of alcohol use.   Review of Systems CONSTITUTIONAL: No weight loss, fever, chills, weakness or fatigue.  HEENT: Eyes: No visual loss, blurred vision, double vision or yellow sclerae.No hearing loss, sneezing, congestion, runny nose or sore throat.  SKIN: No rash or itching.  CARDIOVASCULAR: per hpi RESPIRATORY: No shortness of breath, cough or sputum.  GASTROINTESTINAL: No anorexia, nausea, vomiting or diarrhea. No abdominal pain or blood.  GENITOURINARY: No burning on urination, no polyuria NEUROLOGICAL: No headache, dizziness, syncope, paralysis, ataxia, numbness or tingling in the extremities. No change in bowel or bladder control.  MUSCULOSKELETAL: No muscle, back pain, joint pain or stiffness.  LYMPHATICS: No enlarged nodes. No history of splenectomy.  PSYCHIATRIC: No history of depression or anxiety.  ENDOCRINOLOGIC: No reports of sweating, cold or heat intolerance. No polyuria or polydipsia.  Christia Reading   Physical Examination Today's Vitals   08/21/20 1319  BP: (!) 174/100  Pulse: 70  SpO2: 97%  Weight: 257 lb (116.6 kg)  Height: 5\' 7"  (1.702 m)   Body mass index is 40.25 kg/m.  Gen: resting comfortably, no acute distress HEENT: no scleral icterus, pupils equal round and reactive, no palptable cervical adenopathy,  CV: RRR, no m/r/g, no jvd Resp: Clear to auscultation bilaterally GI: abdomen is soft, non-tender, non-distended, normal  bowel sounds, no hepatosplenomegaly MSK: extremities are warm, no edema.  Skin: warm, no rash Neuro:  no focal deficits Psych: appropriate affect   Diagnostic Studies     Assessment and Plan  1. PAF - CHADS2Vasc score is 2 (HTN, gender) - we will recheck eliquis to see if more affordable given her insurance change - unclear if true hair loss from coumadin or norvasc as started and stopped both around the same time - EKG today shows NSR   2.  HTN - elevated in clinic.  - need baseline labs before considering additional bp agent. Unclear if hair loss was due to norvasc but would avoid at this time   F/u 4-6 weeks to f/u bp;s      Antoine Poche, M.D.

## 2020-08-22 ENCOUNTER — Telehealth: Payer: Self-pay | Admitting: *Deleted

## 2020-08-22 MED ORDER — APIXABAN 5 MG PO TABS: 5.0000 mg | ORAL_TABLET | Freq: Two times a day (BID) | ORAL | 6 refills | Status: DC

## 2020-08-22 NOTE — Telephone Encounter (Signed)
Patient informed and verbalized understanding of plan.  Patient request to pick up $10 eliquis copay card from [place] office.

## 2020-08-22 NOTE — Telephone Encounter (Signed)
-----   Message from Cheree Ditto, [facility] sent at 08/21/2020  2:07 PM EDT ----- Regarding: RE: cost of eliquis 5 mg BID Using the copay card, price should be 10 dollars a month.  If you do not have any in your office, they are available online   ----- Message ----- From: Eustace Moore, RN Sent: 08/21/2020   1:57 PM EDT To: [facility] Subject: cost of eliquis 5 mg BID                       Hello. Dr. Wyline Mood is asking if you all can give [place] the price for eliquis each month for this patient.  Thanks for your help

## 2020-08-22 NOTE — Telephone Encounter (Signed)
Noted and complete. 

## 2020-09-11 ENCOUNTER — Ambulatory Visit: Payer: BC Managed Care – PPO | Admitting: Orthopaedic Surgery

## 2020-09-11 ENCOUNTER — Encounter: Payer: Self-pay | Admitting: Orthopaedic Surgery

## 2020-09-11 ENCOUNTER — Other Ambulatory Visit: Payer: Self-pay

## 2020-09-11 VITALS — BP 158/86 | HR 87 | Ht 67.0 in | Wt 253.0 lb

## 2020-09-11 DIAGNOSIS — G8929 Other chronic pain: Secondary | ICD-10-CM

## 2020-09-11 DIAGNOSIS — M545 Low back pain, unspecified: Secondary | ICD-10-CM

## 2020-09-11 DIAGNOSIS — F17210 Nicotine dependence, cigarettes, uncomplicated: Secondary | ICD-10-CM | POA: Diagnosis not present

## 2020-09-11 MED ORDER — HYDROCODONE-ACETAMINOPHEN 7.5-325 MG PO TABS: 1.0000 | ORAL_TABLET | Freq: Four times a day (QID) | ORAL | 0 refills | Status: DC | PRN

## 2020-09-11 NOTE — Progress Notes (Signed)
Patient GD:JMEQAST JADESOLA Ramirez, female DOB:1965/09/22, 55 y.o. MHD:[id number]  Chief Complaint  Patient presents with  . Back Pain    LBP     HPI  Wendy Ramirez is a 55 y.o. female who has lower back pain.  She has no weakness, no new trauma. The cold weather recently has made her worse.  She is doing her exercises and taking her medicine.  She is losening weight.   Body mass index is 39.63 kg/m.  ROS  Review of Systems  HENT: Negative for congestion.   Respiratory: Positive for shortness of breath. Negative for cough.   Musculoskeletal: Positive for arthralgias, back pain and myalgias.  All other systems reviewed and are negative.   All other systems reviewed and are negative.  The following is a summary of the past history medically, past history surgically, known current medicines, social history and family history.  This information is gathered electronically by the computer from prior information and documentation.  I review this each visit and have found including this information at this point in the chart is beneficial and informative.    Past Medical History:  Diagnosis Date  . Atrial fibrillation (HCC)   . Back pain   . Diverticulitis   . Pneumonia   . SVT (supraventricular tachycardia) (HCC)     Past Surgical History:  Procedure Laterality Date  . CHOLECYSTECTOMY    . TUBAL LIGATION      Family History  Problem Relation Age of Onset  . Heart disease Father   . Atrial fibrillation Maternal Grandmother   . Heart attack Paternal Grandfather     Social History Social History   Tobacco Use  . Smoking status: Current Every Day Smoker    Packs/day: 1.00    Types: Cigarettes    Start date: 06/19/1993  . Smokeless tobacco: Never Used  Vaping Use  . Vaping Use: Every day  . Start date: 10/17/2013  Substance Use Topics  . Alcohol use: No  . Drug use: No    No Known Allergies  Current Outpatient Medications  Medication Sig Dispense Refill  . apixaban  (ELIQUIS) 5 MG TABS tablet Take 1 tablet (5 mg total) by mouth 2 (two) times daily. 60 tablet 6  . celecoxib (CELEBREX) 200 MG capsule TAKE 1 CAPSULE BY MOUTH EVERY DAY AFTER A MEAL 30 capsule 5  . HYDROcodone-acetaminophen (NORCO) 7.5-325 MG tablet Take 1 tablet by mouth every 6 (six) hours as needed for moderate pain (Must last 30 days). 45 tablet 0  . metoprolol tartrate (LOPRESSOR) 100 MG tablet TAKE 1 TABLET(100 MG) BY MOUTH TWICE DAILY 180 tablet 3   No current facility-administered medications for this visit.     Physical Exam  Blood pressure (!) 158/86, pulse 87, height 5\' 7"  (1.702 m), weight 253 lb (114.8 kg), last menstrual period 05/23/2013.  Constitutional: overall normal hygiene, normal nutrition, well developed, normal grooming, normal body habitus. Assistive device:none  Musculoskeletal: gait and station Limp none, muscle tone and strength are normal, no tremors or atrophy is present.  .  Neurological: coordination overall normal.  Deep tendon reflex/nerve stretch intact.  Sensation normal.  Cranial nerves II-XII intact.   Skin:   Normal overall no scars, lesions, ulcers or rashes. No psoriasis.  Psychiatric: Alert and oriented x 3.  Recent memory intact, remote memory unclear.  Normal mood and affect. Well groomed.  Good eye contact.  Cardiovascular: overall no swelling, no varicosities, no edema bilaterally, normal temperatures of the legs and arms,  no clubbing, cyanosis and good capillary refill.  Lymphatic: palpation is normal.  Spine/Pelvis examination:  Inspection:  Overall, sacoiliac joint benign and hips nontender; without crepitus or defects.   Thoracic spine inspection: Alignment normal without kyphosis present   Lumbar spine inspection:  Alignment  with normal lumbar lordosis, without scoliosis apparent.   Thoracic spine palpation:  without tenderness of spinal processes   Lumbar spine palpation: without tenderness of lumbar area; without tightness of  lumbar muscles    Range of Motion:   Lumbar flexion, forward flexion is normal without pain or tenderness    Lumbar extension is full without pain or tenderness   Left lateral bend is normal without pain or tenderness   Right lateral bend is normal without pain or tenderness   Straight leg raising is normal  Strength & tone: normal   Stability overall normal stability All other systems reviewed and are negative   The patient has been educated about the nature of the problem(s) and counseled on treatment options.  The patient appeared to understand what I have discussed and is in agreement with it.  Encounter Diagnoses  Name Primary?  . Chronic midline low back pain without sciatica Yes  . Cigarette nicotine dependence without complication     PLAN Call if any problems.  Precautions discussed.  Continue current medications.   Return to clinic 3 months   I have reviewed the [facility] Controlled Substance Reporting System web site prior to prescribing narcotic medicine for this patient.   Electronically Signed Darreld Mclean, MD 4/19/20222:29 PM

## 2020-09-20 NOTE — Progress Notes (Deleted)
Cardiology Office Note  Date: 09/21/2020   ID: Ninamarie, Keel [birth year], MRN [id number]  PCP:  Assunta Found, MD  Cardiologist:  Dina Rich, MD Electrophysiologist:  None   Chief Complaint: Follow-up of atrial fibrillation  History of Present Illness: Wendy Ramirez is a 55 y.o. female with a history of atrial fibrillation, elevated glucose, HTN, SVT.  Last saw Dr. Wyline Mood on 08/21/2020.  She had been seen in the emergency room the prior day with chest pain and palpitations.  She was found to be in atrial fibrillation with RVR.  She converted back to normal sinus rhythm after IV Cardizem.  Her Toprol-XL was increased to 100 mg daily.  Her Eliquis was changed to Coumadin due to cost.  Norvasc was started.  She noticed hair loss.  She stopped Norvasc and Coumadin medications and hair loss resolved.  She was currently taking aspirin.  CHA2DS2-VASc score was 2.  She was not checking her blood pressures at home.  She was compliant with her antihypertensive medications.  Plans were to recheck Eliquis to see if more affordable given her insurance change.Marland Kitchen  Her EKG showed normal sinus rhythm at office visit.  Past Medical History:  Diagnosis Date  . Atrial fibrillation (HCC)   . Back pain   . Diverticulitis   . Pneumonia   . SVT (supraventricular tachycardia) (HCC)     Past Surgical History:  Procedure Laterality Date  . CHOLECYSTECTOMY    . TUBAL LIGATION      Current Outpatient Medications  Medication Sig Dispense Refill  . apixaban (ELIQUIS) 5 MG TABS tablet Take 1 tablet (5 mg total) by mouth 2 (two) times daily. 60 tablet 6  . celecoxib (CELEBREX) 200 MG capsule TAKE 1 CAPSULE BY MOUTH EVERY DAY AFTER A MEAL 30 capsule 5  . HYDROcodone-acetaminophen (NORCO) 7.5-325 MG tablet Take 1 tablet by mouth every 6 (six) hours as needed for moderate pain (Must last 30 days). 45 tablet 0  . metoprolol tartrate (LOPRESSOR) 100 MG tablet TAKE 1 TABLET(100 MG) BY MOUTH TWICE DAILY  180 tablet 3   No current facility-administered medications for this visit.   Allergies:  Patient has no known allergies.   Social History: The patient  reports that she has been smoking cigarettes. She started smoking about 27 years ago. She has been smoking about 1.00 pack per day. She has never used smokeless tobacco. She reports that she does not drink alcohol and does not use drugs.   Family History: The patient's family history includes Atrial fibrillation in her maternal grandmother; Heart attack in her paternal grandfather; Heart disease in her father.   ROS:  Please see the history of present illness. Otherwise, complete review of systems is positive for {NONE DEFAULTED:18576::"none"}.  All other systems are reviewed and negative.   Physical Exam: VS:  LMP 05/23/2013 , BMI There is no height or weight on file to calculate BMI.  Wt Readings from Last 3 Encounters:  09/11/20 253 lb (114.8 kg)  08/21/20 257 lb (116.6 kg)  06/12/20 250 lb (113.4 kg)    General: Patient appears comfortable at rest. HEENT: Conjunctiva and lids normal, oropharynx clear with moist mucosa. Neck: Supple, no elevated JVP or carotid bruits, no thyromegaly. Lungs: Clear to auscultation, nonlabored breathing at rest. Cardiac: Regular rate and rhythm, no S3 or significant systolic murmur, no pericardial rub. Abdomen: Soft, nontender, no hepatomegaly, bowel sounds present, no guarding or rebound. Extremities: No pitting edema, distal pulses 2+. Skin: Warm and  dry. Musculoskeletal: No kyphosis. Neuropsychiatric: Alert and oriented x3, affect grossly appropriate.  ECG:  {EKG/Telemetry Strips Reviewed:[id number]}  Recent Labwork: No results found for requested labs within last 8760 hours.  No results found for: CHOL, TRIG, HDL, CHOLHDL, VLDL, LDLCALC, LDLDIRECT  Other Studies Reviewed Today:   Assessment and Plan:  1. Atrial fibrillation, unspecified type (HCC)   2. Essential hypertension       Medication Adjustments/Labs and Tests Ordered: Current medicines are reviewed at length with the patient today.  Concerns regarding medicines are outlined above.   Disposition: Follow-up with ***  Signed, Wendy Harding, NP 09/21/2020 7:58 AM    [facility] [address] [place], [place], [place] [address] Phone: [phone]; Fax: [phone]

## 2020-09-21 ENCOUNTER — Ambulatory Visit: Payer: BC Managed Care – PPO | Admitting: Family Medicine

## 2020-10-09 ENCOUNTER — Telehealth: Payer: Self-pay | Admitting: Orthopaedic Surgery

## 2020-10-09 MED ORDER — HYDROCODONE-ACETAMINOPHEN 7.5-325 MG PO TABS: 1.0000 | ORAL_TABLET | Freq: Four times a day (QID) | ORAL | 0 refills | Status: DC | PRN

## 2020-10-26 ENCOUNTER — Other Ambulatory Visit: Payer: Self-pay | Admitting: Orthopedic Surgery

## 2020-11-08 ENCOUNTER — Telehealth: Payer: Self-pay | Admitting: Orthopaedic Surgery

## 2020-11-12 MED ORDER — HYDROCODONE-ACETAMINOPHEN 7.5-325 MG PO TABS
1.0000 | ORAL_TABLET | Freq: Four times a day (QID) | ORAL | 0 refills | Status: DC | PRN
Start: 2020-11-12 — End: 2020-12-11

## 2020-12-11 ENCOUNTER — Other Ambulatory Visit: Payer: Self-pay

## 2020-12-11 ENCOUNTER — Ambulatory Visit: Payer: BC Managed Care – PPO | Admitting: Orthopaedic Surgery

## 2020-12-11 ENCOUNTER — Encounter: Payer: Self-pay | Admitting: Orthopaedic Surgery

## 2020-12-11 VITALS — BP 182/99 | HR 71 | Ht 67.0 in | Wt 256.4 lb

## 2020-12-11 DIAGNOSIS — M545 Low back pain, unspecified: Secondary | ICD-10-CM

## 2020-12-11 DIAGNOSIS — F17210 Nicotine dependence, cigarettes, uncomplicated: Secondary | ICD-10-CM | POA: Diagnosis not present

## 2020-12-11 DIAGNOSIS — G8929 Other chronic pain: Secondary | ICD-10-CM

## 2020-12-11 MED ORDER — HYDROCODONE-ACETAMINOPHEN 7.5-325 MG PO TABS
1.0000 | ORAL_TABLET | Freq: Four times a day (QID) | ORAL | 0 refills | Status: DC | PRN
Start: 2020-12-11 — End: 2021-01-10

## 2020-12-11 NOTE — Progress Notes (Signed)
My back is a little better with the heat  She has chronic lower back pain.  She has had less pain recently but still has good and bad days.  She has no new trauma, no numbness.  She is taking her medicine.  Spine/Pelvis examination:  Inspection:  Overall, sacoiliac joint benign and hips nontender; without crepitus or defects.   Thoracic spine inspection: Alignment normal without kyphosis present   Lumbar spine inspection:  Alignment  with normal lumbar lordosis, without scoliosis apparent.   Thoracic spine palpation:  without tenderness of spinal processes   Lumbar spine palpation: without tenderness of lumbar area; without tightness of lumbar muscles    Range of Motion:   Lumbar flexion, forward flexion is normal without pain or tenderness    Lumbar extension is full without pain or tenderness   Left lateral bend is normal without pain or tenderness   Right lateral bend is normal without pain or tenderness   Straight leg raising is normal  Strength & tone: normal   Stability overall normal stability  Encounter Diagnoses  Name Primary?   Chronic midline low back pain without sciatica Yes   Cigarette nicotine dependence without complication    I have reviewed the [place] Controlled Substance Reporting System web site prior to prescribing narcotic medicine for this patient.  Return in three months.  Call if any problem.  Precautions discussed.  Electronically Signed Darreld Mclean, MD 7/19/20222:44 PM

## 2021-01-09 ENCOUNTER — Telehealth: Payer: Self-pay | Admitting: Orthopaedic Surgery

## 2021-01-09 NOTE — Telephone Encounter (Signed)
Refill request for Hydrocodone/Acetaminophen 7.5-325 mgs.  Qty  40      Sig: Take 1 tablet by mouth every 6 (six) hours as needed for moderate pain (Must last 30 days).   [facility] uses [facility]

## 2021-01-10 MED ORDER — HYDROCODONE-ACETAMINOPHEN 7.5-325 MG PO TABS: 1.0000 | ORAL_TABLET | Freq: Four times a day (QID) | ORAL | 0 refills | Status: DC | PRN

## 2021-02-06 ENCOUNTER — Telehealth: Payer: Self-pay | Admitting: Orthopaedic Surgery

## 2021-02-07 MED ORDER — HYDROCODONE-ACETAMINOPHEN 7.5-325 MG PO TABS
1.0000 | ORAL_TABLET | Freq: Four times a day (QID) | ORAL | 0 refills | Status: DC | PRN
Start: 2021-02-07 — End: 2021-03-12

## 2021-03-05 ENCOUNTER — Telehealth: Payer: Self-pay | Admitting: Orthopaedic Surgery

## 2021-03-05 NOTE — Telephone Encounter (Signed)
Amy,   Quincy Carnes called and left a voicemail asking to speak with you about Wendy Ramirez.   She said she has questions to ask you  Please call her back at [phone]

## 2021-03-05 NOTE — Telephone Encounter (Signed)
I spoke to patient/ but not about Metzli was about herself.  Can not discuss this patient with her, she was not wanting to discuss her. See other chart.

## 2021-03-11 ENCOUNTER — Other Ambulatory Visit: Payer: Self-pay | Admitting: Orthopaedic Surgery

## 2021-03-12 ENCOUNTER — Ambulatory Visit: Payer: BC Managed Care – PPO | Admitting: Orthopaedic Surgery

## 2021-03-12 ENCOUNTER — Other Ambulatory Visit: Payer: Self-pay

## 2021-03-12 ENCOUNTER — Encounter: Payer: Self-pay | Admitting: Orthopaedic Surgery

## 2021-03-12 VITALS — BP 175/95 | HR 68 | Ht 67.0 in | Wt 249.2 lb

## 2021-03-12 DIAGNOSIS — M545 Low back pain, unspecified: Secondary | ICD-10-CM

## 2021-03-12 DIAGNOSIS — F17210 Nicotine dependence, cigarettes, uncomplicated: Secondary | ICD-10-CM | POA: Diagnosis not present

## 2021-03-12 DIAGNOSIS — G8929 Other chronic pain: Secondary | ICD-10-CM

## 2021-03-12 MED ORDER — HYDROCODONE-ACETAMINOPHEN 7.5-325 MG PO TABS
1.0000 | ORAL_TABLET | Freq: Four times a day (QID) | ORAL | 0 refills | Status: DC | PRN
Start: 2021-03-12 — End: 2021-04-11

## 2021-03-12 NOTE — Progress Notes (Signed)
My back is the same.  She has chronic back pain with good and bad days.  Cooler weather makes her worse.  She has no weakness, no new trauma.  She is active and doing her exercises.    Spine/Pelvis examination:  Inspection:  Overall, sacoiliac joint benign and hips nontender; without crepitus or defects.   Thoracic spine inspection: Alignment normal without kyphosis present   Lumbar spine inspection:  Alignment  with normal lumbar lordosis, without scoliosis apparent.   Thoracic spine palpation:  without tenderness of spinal processes   Lumbar spine palpation: without tenderness of lumbar area; without tightness of lumbar muscles    Range of Motion:   Lumbar flexion, forward flexion is normal without pain or tenderness    Lumbar extension is full without pain or tenderness   Left lateral bend is normal without pain or tenderness   Right lateral bend is normal without pain or tenderness   Straight leg raising is normal  Strength & tone: normal   Stability overall normal stability Encounter Diagnoses  Name Primary?   Chronic midline low back pain without sciatica Yes   Cigarette nicotine dependence without complication    I have reviewed the [place] Controlled Substance Reporting System web site prior to prescribing narcotic medicine for this patient.  Return in three months.  Electronically Signed Darreld Mclean, MD 10/18/20222:39 PM

## 2021-04-11 ENCOUNTER — Telehealth: Payer: Self-pay | Admitting: Orthopaedic Surgery

## 2021-04-11 MED ORDER — HYDROCODONE-ACETAMINOPHEN 7.5-325 MG PO TABS
1.0000 | ORAL_TABLET | Freq: Four times a day (QID) | ORAL | 0 refills | Status: DC | PRN
Start: 2021-04-11 — End: 2021-05-08

## 2021-05-08 ENCOUNTER — Telehealth: Payer: Self-pay | Admitting: Orthopaedic Surgery

## 2021-05-09 MED ORDER — HYDROCODONE-ACETAMINOPHEN 7.5-325 MG PO TABS: 1.0000 | ORAL_TABLET | Freq: Four times a day (QID) | ORAL | 0 refills | Status: DC | PRN

## 2021-06-06 ENCOUNTER — Telehealth: Payer: Self-pay | Admitting: Orthopaedic Surgery

## 2021-06-10 ENCOUNTER — Telehealth: Payer: Self-pay | Admitting: Orthopedic Surgery

## 2021-06-11 ENCOUNTER — Encounter: Payer: Self-pay | Admitting: Orthopaedic Surgery

## 2021-06-11 ENCOUNTER — Ambulatory Visit: Payer: BC Managed Care – PPO | Admitting: Orthopaedic Surgery

## 2021-06-11 ENCOUNTER — Other Ambulatory Visit: Payer: Self-pay

## 2021-06-11 VITALS — BP 180/99 | HR 73 | Ht 67.0 in | Wt 240.0 lb

## 2021-06-11 DIAGNOSIS — M545 Low back pain, unspecified: Secondary | ICD-10-CM

## 2021-06-11 DIAGNOSIS — G8929 Other chronic pain: Secondary | ICD-10-CM

## 2021-06-11 MED ORDER — HYDROCODONE-ACETAMINOPHEN 7.5-325 MG PO TABS: 1.0000 | ORAL_TABLET | Freq: Four times a day (QID) | ORAL | 0 refills | Status: DC | PRN

## 2021-06-11 NOTE — Progress Notes (Signed)
I have those days.  She has lower back pain that comes and goes.  The cold weather has made it worse. She has no numbness, no new trauma.  She is active.  Spine/Pelvis examination:  Inspection:  Overall, sacoiliac joint benign and hips nontender; without crepitus or defects.   Thoracic spine inspection: Alignment normal without kyphosis present   Lumbar spine inspection:  Alignment  with normal lumbar lordosis, without scoliosis apparent.   Thoracic spine palpation:  without tenderness of spinal processes   Lumbar spine palpation: without tenderness of lumbar area; without tightness of lumbar muscles    Range of Motion:   Lumbar flexion, forward flexion is normal without pain or tenderness    Lumbar extension is full without pain or tenderness   Left lateral bend is normal without pain or tenderness   Right lateral bend is normal without pain or tenderness   Straight leg raising is normal  Strength & tone: normal   Stability overall normal stability;  Encounter Diagnosis  Name Primary?   Chronic midline low back pain without sciatica Yes   I had renewed her pain medicine earlier today.  Return in three months.  Call if any problem.  Precautions discussed.  Electronically Signed Darreld Mclean, MD 1/17/20232:52 PM

## 2021-07-09 ENCOUNTER — Telehealth: Payer: Self-pay | Admitting: Orthopaedic Surgery

## 2021-07-09 MED ORDER — HYDROCODONE-ACETAMINOPHEN 7.5-325 MG PO TABS: 1.0000 | ORAL_TABLET | Freq: Four times a day (QID) | ORAL | 0 refills | Status: DC | PRN

## 2021-07-22 ENCOUNTER — Other Ambulatory Visit: Payer: Self-pay | Admitting: Cardiology

## 2021-08-06 ENCOUNTER — Telehealth: Payer: Self-pay | Admitting: Orthopaedic Surgery

## 2021-08-07 MED ORDER — HYDROCODONE-ACETAMINOPHEN 7.5-325 MG PO TABS: 1.0000 | ORAL_TABLET | Freq: Four times a day (QID) | ORAL | 0 refills | Status: DC | PRN

## 2021-08-22 ENCOUNTER — Telehealth: Payer: Self-pay | Admitting: Cardiology

## 2021-08-22 NOTE — Telephone Encounter (Signed)
?*  STAT* If patient is at the pharmacy, call can be transferred to refill team. ? ? ?1. Which medications need to be refilled? (please list name of each medication and dose if known) metoprolol tartrate (LOPRESSOR) 100 MG tablet ? ?2. Which pharmacy/location (including street and city if local pharmacy) is medication to be sent to?  ?[id number] - [place], [address] Phone:  [phone]  ?Fax:  [phone]  ?  ? ? ?3. Do they need a 30 day or 90 day supply?  Enough to make it to her appt in May to see Dr Harl Bowie   ?

## 2021-08-22 NOTE — Telephone Encounter (Signed)
Lopressor refill sent already ?

## 2021-09-10 ENCOUNTER — Telehealth: Payer: Self-pay

## 2021-09-10 ENCOUNTER — Encounter: Payer: Self-pay | Admitting: Orthopaedic Surgery

## 2021-09-10 ENCOUNTER — Ambulatory Visit (INDEPENDENT_AMBULATORY_CARE_PROVIDER_SITE_OTHER): Payer: Self-pay | Admitting: Orthopaedic Surgery

## 2021-09-10 VITALS — Ht 67.0 in | Wt 240.0 lb

## 2021-09-10 DIAGNOSIS — M545 Low back pain, unspecified: Secondary | ICD-10-CM

## 2021-09-10 DIAGNOSIS — G8929 Other chronic pain: Secondary | ICD-10-CM

## 2021-09-10 DIAGNOSIS — F17210 Nicotine dependence, cigarettes, uncomplicated: Secondary | ICD-10-CM

## 2021-09-10 MED ORDER — HYDROCODONE-ACETAMINOPHEN 7.5-325 MG PO TABS: 1.0000 | ORAL_TABLET | Freq: Four times a day (QID) | ORAL | 0 refills | Status: DC | PRN

## 2021-09-10 NOTE — Telephone Encounter (Signed)
Hydrocodone-Acetaminophen 7.5/325 MG  Qty 40 Tablets ? ?PATIENT USES [facility] [address] ?

## 2021-09-10 NOTE — Progress Notes (Signed)
She has lower back pain, chronic with good and bad days.  She has no numbness, no new trauma, no weakness.  She is doing her exercises and taking her medicine. ? ?Spine/Pelvis examination: ? Inspection:  Overall, sacoiliac joint benign and hips nontender; without crepitus or defects. ? ? Thoracic spine inspection: Alignment normal without kyphosis present ? ? Lumbar spine inspection:  Alignment  with normal lumbar lordosis, without scoliosis apparent. ? ? Thoracic spine palpation:  without tenderness of spinal processes ? ? Lumbar spine palpation: without tenderness of lumbar area; without tightness of lumbar muscles  ? ? Range of Motion: ?  Lumbar flexion, forward flexion is normal without pain or tenderness  ?  Lumbar extension is full without pain or tenderness ?  Left lateral bend is normal without pain or tenderness ?  Right lateral bend is normal without pain or tenderness ?  Straight leg raising is normal ? Strength & tone: normal ? ? Stability overall normal stability ? ?Encounter Diagnoses  ?Name Primary?  ? Chronic midline low back pain without sciatica Yes  ? Cigarette nicotine dependence without complication   ? ?I have reviewed the [place] Controlled Substance Reporting System web site prior to prescribing narcotic medicine for this patient. ? ?I renewed her medicine for pain. ? ?Return in three months. ? ?Call if any problem. ? ?Precautions discussed. ? ?Electronically Signed ?Darreld Mclean, MD ?4/18/20232:29 PM ? ?

## 2021-09-11 ENCOUNTER — Other Ambulatory Visit: Payer: Self-pay | Admitting: Cardiology

## 2021-09-11 MED ORDER — METOPROLOL TARTRATE 100 MG PO TABS: 100.0000 mg | ORAL_TABLET | Freq: Two times a day (BID) | ORAL | 0 refills | Status: DC

## 2021-10-08 ENCOUNTER — Other Ambulatory Visit: Payer: Self-pay | Admitting: Orthopaedic Surgery

## 2021-10-09 ENCOUNTER — Other Ambulatory Visit: Payer: Self-pay | Admitting: Cardiology

## 2021-10-09 MED ORDER — HYDROCODONE-ACETAMINOPHEN 7.5-325 MG PO TABS: 1.0000 | ORAL_TABLET | Freq: Four times a day (QID) | ORAL | 0 refills | Status: DC | PRN

## 2021-10-17 ENCOUNTER — Ambulatory Visit (INDEPENDENT_AMBULATORY_CARE_PROVIDER_SITE_OTHER): Payer: Self-pay | Admitting: Cardiology

## 2021-10-17 ENCOUNTER — Encounter: Payer: Self-pay | Admitting: *Deleted

## 2021-10-17 ENCOUNTER — Encounter: Payer: Self-pay | Admitting: Cardiology

## 2021-10-17 VITALS — BP 134/100 | HR 67 | Ht 67.0 in | Wt 246.4 lb

## 2021-10-17 DIAGNOSIS — I10 Essential (primary) hypertension: Secondary | ICD-10-CM

## 2021-10-17 DIAGNOSIS — I480 Paroxysmal atrial fibrillation: Secondary | ICD-10-CM

## 2021-10-17 MED ORDER — LOSARTAN POTASSIUM 25 MG PO TABS: 25.0000 mg | ORAL_TABLET | Freq: Every day | ORAL | 6 refills | Status: DC

## 2021-10-17 NOTE — Patient Instructions (Addendum)
Medication Instructions:  Begin Losartan 25mg  daily  Continue all other medications.     Labwork: none  Testing/Procedures: BMET - order given today  Please do in 2 weeks (around 10/31/2021) Office will contact with results via phone or letter.     Follow-Up: 6 months   Any Other Special Instructions Will Be Listed Below (If Applicable).   If you need a refill on your cardiac medications before your next appointment, please call your pharmacy.

## 2021-10-17 NOTE — Progress Notes (Signed)
Clinical Summary Ms. Madera is a 56 y.o.female seen today for follow up of the following medical problems   1. Paroxysmal Afib - Seen in ER with chest pain and palpitations.  - she reports high caffeine intake - found to be in afib with RVR in ER. Converted back to NSR after IV cardizem. Toprol XL was increased to 100mg  dialy.    - patient changed from eliquis to coumadin due to cost - around the time of starting coumadin also started norvasc. Noted significant hair loss, stopped both medicines and hair loss resolved. Currently just taking ASA. CHADSVasc score is 2.      - no palpitations. Taking lopressor 100mg  bid - eliquis too expensive. Does retry coumadin.      2. HTN - does not check bps at home.  - compliantw with meds   - unclear if norvasc contributed to hair loss - possible hair loss  3. Hair loss - hair loss started when starting coumadin and norvasc around the same time, she stopped both meds and hair loss resolved.  Past Medical History:  Diagnosis Date   Atrial fibrillation (HCC)    Back pain    Diverticulitis    Pneumonia    SVT (supraventricular tachycardia) (HCC)      No Known Allergies   Current Outpatient Medications  Medication Sig Dispense Refill   HYDROcodone-acetaminophen (NORCO) 7.5-325 MG tablet Take 1 tablet by mouth every 6 (six) hours as needed for moderate pain (Must last 30 days). 40 tablet 0   apixaban (ELIQUIS) 5 MG TABS tablet Take 1 tablet (5 mg total) by mouth 2 (two) times daily. (Patient not taking: Reported on 09/10/2021) 60 tablet 6   celecoxib (CELEBREX) 200 MG capsule TAKE 1 CAPSULE BY MOUTH EVERY DAY AFTER A MEAL (Patient not taking: Reported on 09/10/2021) 30 capsule 5   metoprolol tartrate (LOPRESSOR) 100 MG tablet TAKE 1 TABLET(100 MG) BY MOUTH TWICE DAILY 60 tablet 3   No current facility-administered medications for this visit.     Past Surgical History:  Procedure Laterality Date   CHOLECYSTECTOMY     TUBAL  LIGATION       No Known Allergies    Family History  Problem Relation Age of Onset   Heart disease Father    Atrial fibrillation Maternal Grandmother    Heart attack Paternal Grandfather      Social History Ms. Shiplett reports that she has been smoking cigarettes. She started smoking about 28 years ago. She has been smoking an average of 1 pack per day. She has never used smokeless tobacco. Ms. Valvano reports no history of alcohol use.   Review of Systems CONSTITUTIONAL: No weight loss, fever, chills, weakness or fatigue.  HEENT: Eyes: No visual loss, blurred vision, double vision or yellow sclerae.No hearing loss, sneezing, congestion, runny nose or sore throat.  SKIN: No rash or itching.  CARDIOVASCULAR: per hpi RESPIRATORY: No shortness of breath, cough or sputum.  GASTROINTESTINAL: No anorexia, nausea, vomiting or diarrhea. No abdominal pain or blood.  GENITOURINARY: No burning on urination, no polyuria NEUROLOGICAL: No headache, dizziness, syncope, paralysis, ataxia, numbness or tingling in the extremities. No change in bowel or bladder control.  MUSCULOSKELETAL: No muscle, back pain, joint pain or stiffness.  LYMPHATICS: No enlarged nodes. No history of splenectomy.  PSYCHIATRIC: No history of depression or anxiety.  ENDOCRINOLOGIC: No reports of sweating, cold or heat intolerance. No polyuria or polydipsia.  Christia Reading   Physical Examination Today's Vitals  10/17/21 1259  BP: (!) 134/100  Pulse: 67  SpO2: 95%  Weight: 246 lb 6.4 oz (111.8 kg)  Height: 5\' 7"  (1.702 m)   Body mass index is 38.59 kg/m.  Gen: resting comfortably, no acute distress HEENT: no scleral icterus, pupils equal round and reactive, no palptable cervical adenopathy,  CV: RRR, no m/r/g no jvd Resp: Clear to auscultation bilaterally GI: abdomen is soft, non-tender, non-distended, normal bowel sounds, no hepatosplenomegaly MSK: extremities are warm, no edema.  Skin: warm, no rash Neuro:  no focal  deficits Psych: appropriate affect   Diagnostic Studies     Assessment and Plan  1. PAF - CHADS2Vasc score is 2 (HTN, gender) - no recent symptoms - eliquis is too expensive, she does not want to restart coumadin - CHAD2Vasc score of 2 in a woman recommendation for anticoag only a 2B, reasonable not to be on at this time. Discussed if insurance status changes or willing to retry coumadin let know.  - EKG today shows NSR     2. HTN - elevated in clinic.  -possibly hair loss on norvasc, unclear - start losartan 25mg  daily. If too expensive based on insurance status could change to lisinopril 10mg  daily, needs bmet 2 weeks.      F/u 6 months      [place], M.D.

## 2021-11-08 ENCOUNTER — Telehealth: Payer: Self-pay | Admitting: Orthopedic Surgery

## 2021-11-12 MED ORDER — HYDROCODONE-ACETAMINOPHEN 7.5-325 MG PO TABS: 1.0000 | ORAL_TABLET | Freq: Four times a day (QID) | ORAL | 0 refills | Status: DC | PRN

## 2021-12-10 ENCOUNTER — Encounter: Payer: Self-pay | Admitting: Orthopaedic Surgery

## 2021-12-10 ENCOUNTER — Ambulatory Visit (INDEPENDENT_AMBULATORY_CARE_PROVIDER_SITE_OTHER): Payer: Self-pay | Admitting: Orthopaedic Surgery

## 2021-12-10 VITALS — BP 161/81 | HR 61 | Ht 67.0 in | Wt 243.0 lb

## 2021-12-10 DIAGNOSIS — G8929 Other chronic pain: Secondary | ICD-10-CM

## 2021-12-10 DIAGNOSIS — M545 Low back pain, unspecified: Secondary | ICD-10-CM

## 2021-12-10 DIAGNOSIS — F17210 Nicotine dependence, cigarettes, uncomplicated: Secondary | ICD-10-CM

## 2021-12-10 MED ORDER — HYDROCODONE-ACETAMINOPHEN 7.5-325 MG PO TABS: 1.0000 | ORAL_TABLET | Freq: Four times a day (QID) | ORAL | 0 refills | Status: DC | PRN

## 2021-12-10 NOTE — Progress Notes (Signed)
My back is about the same.  She has less lower back pain in the warm weather.  She has no weakness, no numbness.  She is active and doing her exercises and taking her medicine.  Spine/Pelvis examination:  Inspection:  Overall, sacoiliac joint benign and hips nontender; without crepitus or defects.   Thoracic spine inspection: Alignment normal without kyphosis present   Lumbar spine inspection:  Alignment  with normal lumbar lordosis, without scoliosis apparent.   Thoracic spine palpation:  without tenderness of spinal processes   Lumbar spine palpation: without tenderness of lumbar area; without tightness of lumbar muscles    Range of Motion:   Lumbar flexion, forward flexion is normal without pain or tenderness    Lumbar extension is full without pain or tenderness   Left lateral bend is normal without pain or tenderness   Right lateral bend is normal without pain or tenderness   Straight leg raising is normal  Strength & tone: normal   Stability overall normal stability  Encounter Diagnoses  Name Primary?   Chronic midline low back pain without sciatica Yes   Cigarette nicotine dependence without complication    I will refill her pain medicine.  Call if any problem.  Precautions discussed.  Electronically Signed Darreld Mclean, MD 7/18/20232:39 PM

## 2022-01-08 ENCOUNTER — Telehealth: Payer: Self-pay | Admitting: Orthopaedic Surgery

## 2022-01-08 MED ORDER — HYDROCODONE-ACETAMINOPHEN 7.5-325 MG PO TABS: 1.0000 | ORAL_TABLET | Freq: Four times a day (QID) | ORAL | 0 refills | Status: DC | PRN

## 2022-02-08 ENCOUNTER — Other Ambulatory Visit: Payer: Self-pay | Admitting: Cardiology

## 2022-02-09 ENCOUNTER — Telehealth: Payer: Self-pay | Admitting: Orthopaedic Surgery

## 2022-02-10 MED ORDER — HYDROCODONE-ACETAMINOPHEN 7.5-325 MG PO TABS: 1.0000 | ORAL_TABLET | Freq: Four times a day (QID) | ORAL | 0 refills | Status: DC | PRN

## 2022-03-11 ENCOUNTER — Encounter: Payer: Self-pay | Admitting: Orthopaedic Surgery

## 2022-03-11 ENCOUNTER — Ambulatory Visit (INDEPENDENT_AMBULATORY_CARE_PROVIDER_SITE_OTHER): Payer: Self-pay | Admitting: Orthopaedic Surgery

## 2022-03-11 VITALS — BP 168/95 | HR 68 | Ht 67.0 in | Wt 245.0 lb

## 2022-03-11 DIAGNOSIS — G8929 Other chronic pain: Secondary | ICD-10-CM

## 2022-03-11 DIAGNOSIS — F17210 Nicotine dependence, cigarettes, uncomplicated: Secondary | ICD-10-CM

## 2022-03-11 DIAGNOSIS — M545 Low back pain, unspecified: Secondary | ICD-10-CM

## 2022-03-11 MED ORDER — HYDROCODONE-ACETAMINOPHEN 7.5-325 MG PO TABS: 1.0000 | ORAL_TABLET | Freq: Four times a day (QID) | ORAL | 0 refills | Status: DC | PRN

## 2022-03-11 NOTE — Patient Instructions (Signed)
Steps to Quit Smoking Smoking tobacco is the leading cause of preventable death. It can affect almost every organ in the body. Smoking puts you and people around you at risk for many serious, long-lasting (chronic) diseases. Quitting smoking can be hard, but it is one of the best things that you can do for your health. It is never too late to quit. Do not give up if you cannot quit the first time. Some people need to try many times to quit. Do your best to stick to your quit plan, and talk with your doctor if you have any questions or concerns. How do I get ready to quit? Pick a date to quit. Set a date within the next 2 weeks to give you time to prepare. Write down the reasons why you are quitting. Keep this list in places where you will see it often. Tell your family, friends, and co-workers that you are quitting. Their support is important. Talk with your doctor about the choices that may help you quit. Find out if your health insurance will pay for these treatments. Know the people, places, things, and activities that make you want to smoke (triggers). Avoid them. What first steps can I take to quit smoking? Throw away all cigarettes at home, at work, and in your car. Throw away the things that you use when you smoke, such as ashtrays and lighters. Clean your car. Empty the ashtray. Clean your home, including curtains and carpets. What can I do to help me quit smoking? Talk with your doctor about taking medicines and seeing a counselor. You are more likely to succeed when you do both. If you are pregnant or breastfeeding: Talk with your doctor about counseling or other ways to quit smoking. Do not take medicine to help you quit smoking unless your doctor tells you to. Quit right away Quit smoking completely, instead of slowly cutting back on how much you smoke over a period of time. Stopping smoking right away may be more successful than slowly quitting. Go to counseling. In-person is best  if this is an option. You are more likely to quit if you go to counseling sessions regularly. Take medicine You may take medicines to help you quit. Some medicines need a prescription, and some you can buy over-the-counter. Some medicines may contain a drug called nicotine to replace the nicotine in cigarettes. Medicines may: Help you stop having the desire to smoke (cravings). Help to stop the problems that come when you stop smoking (withdrawal symptoms). Your doctor may ask you to use: Nicotine patches, gum, or lozenges. Nicotine inhalers or sprays. Non-nicotine medicine that you take by mouth. Find resources Find resources and other ways to help you quit smoking and remain smoke-free after you quit. They include: Online chats with a counselor. Phone quitlines. Printed self-help materials. Support groups or group counseling. Text messaging programs. Mobile phone apps. Use apps on your mobile phone or tablet that can help you stick to your quit plan. Examples of free services include Quit Guide from the CDC and smokefree.gov  What can I do to make it easier to quit?  Talk to your family and friends. Ask them to support and encourage you. Call a phone quitline, such as 1-800-QUIT-NOW, reach out to support groups, or work with a counselor. Ask people who smoke to not smoke around you. Avoid places that make you want to smoke, such as: Bars. Parties. Smoke-break areas at work. Spend time with people who do not smoke. Lower   the stress in your life. Stress can make you want to smoke. Try these things to lower stress: Getting regular exercise. Doing deep-breathing exercises. Doing yoga. Meditating. What benefits will I see if I quit smoking? Over time, you may have: A better sense of smell and taste. Less coughing and sore throat. A slower heart rate. Lower blood pressure. Clearer skin. Better breathing. Fewer sick days. Summary Quitting smoking can be hard, but it is one of  the best things that you can do for your health. Do not give up if you cannot quit the first time. Some people need to try many times to quit. When you decide to quit smoking, make a plan to help you succeed. Quit smoking right away, not slowly over a period of time. When you start quitting, get help and support to keep you smoke-free. This information is not intended to replace advice given to you by your health care provider. Make sure you discuss any questions you have with your health care provider. Document Revised: 05/03/2021 Document Reviewed: 05/03/2021 Elsevier Patient Education  2023 Elsevier Inc.  

## 2022-03-11 NOTE — Progress Notes (Signed)
My back is the same.  She has done well with her lower back pain this summer.  She has no new trauma, no weakness, no paresthesias.  She is active and taking her medicine.  Spine/Pelvis examination:  Inspection:  Overall, sacoiliac joint benign and hips nontender; without crepitus or defects.   Thoracic spine inspection: Alignment normal without kyphosis present   Lumbar spine inspection:  Alignment  with normal lumbar lordosis, without scoliosis apparent.   Thoracic spine palpation:  without tenderness of spinal processes   Lumbar spine palpation: without tenderness of lumbar area; without tightness of lumbar muscles    Range of Motion:   Lumbar flexion, forward flexion is normal without pain or tenderness    Lumbar extension is full without pain or tenderness   Left lateral bend is normal without pain or tenderness   Right lateral bend is normal without pain or tenderness   Straight leg raising is normal  Strength & tone: normal   Stability overall normal stability  Encounter Diagnoses  Name Primary?   Chronic midline low back pain without sciatica Yes   Cigarette nicotine dependence without complication    I have reviewed the [place] web site prior to prescribing narcotic medicine for this patient.  Return in three months.  Call if any problem.  Precautions discussed.  Electronically Signed Sanjuana Kava, MD 10/17/20232:07 PM

## 2022-04-10 ENCOUNTER — Telehealth: Payer: Self-pay | Admitting: Orthopaedic Surgery

## 2022-04-10 MED ORDER — HYDROCODONE-ACETAMINOPHEN 7.5-325 MG PO TABS: 1.0000 | ORAL_TABLET | Freq: Four times a day (QID) | ORAL | 0 refills | Status: DC | PRN

## 2022-04-19 ENCOUNTER — Encounter (HOSPITAL_COMMUNITY): Payer: Self-pay

## 2022-04-19 ENCOUNTER — Other Ambulatory Visit: Payer: Self-pay

## 2022-04-19 ENCOUNTER — Emergency Department (HOSPITAL_COMMUNITY): Payer: Self-pay

## 2022-04-19 ENCOUNTER — Emergency Department (HOSPITAL_COMMUNITY)
Admission: EM | Admit: 2022-04-19 | Discharge: 2022-04-19 | Disposition: A | Discharge status: Home / Self Care | Payer: Self-pay | Attending: Emergency Medicine | Admitting: Emergency Medicine

## 2022-04-19 DIAGNOSIS — I10 Essential (primary) hypertension: Secondary | ICD-10-CM | POA: Insufficient documentation

## 2022-04-19 DIAGNOSIS — Z79899 Other long term (current) drug therapy: Secondary | ICD-10-CM | POA: Insufficient documentation

## 2022-04-19 DIAGNOSIS — R09A2 Foreign body sensation, throat: Secondary | ICD-10-CM | POA: Insufficient documentation

## 2022-04-19 NOTE — ED Provider Notes (Signed)
[facility] EMERGENCY DEPARTMENT Provider Note   CSN: [id number] Arrival date & time: 04/19/22  1904     History  Chief Complaint  Patient presents with   Food in Throat    Wendy Ramirez is a 56 y.o. female.  HPI   This patient is a 56 year old female, she has a history of hypertension on metoprolol and losartan, she states that tonight while she was trying to eat some bread that she had a large piece of bread that got hung up in her throat when she tried to swallow and it got stuck so she drink lots of water and eventually went down.  She has been able to drink freely since that time but states that she has a foreign body sensation in the middle of her chest.  She is not vomiting, she is not having fevers or any difficulty breathing.  She has never had any dysphagia in the past and has never had an upper endoscopy  Home Medications Prior to Admission medications   Medication Sig Start Date End Date Taking? Authorizing Provider  HYDROcodone-acetaminophen (NORCO) 7.5-325 MG tablet Take 1 tablet by mouth every 6 (six) hours as needed for moderate pain (Must last 30 days). 04/10/22   Darreld Mclean, MD  losartan (COZAAR) 25 MG tablet Take 1 tablet (25 mg total) by mouth daily. 10/17/21   Antoine Poche, MD  metoprolol tartrate (LOPRESSOR) 100 MG tablet TAKE 1 TABLET(100 MG) BY MOUTH TWICE DAILY 02/10/22   Antoine Poche, MD      Allergies    Patient has no known allergies.    Review of Systems   Review of Systems  All other systems reviewed and are negative.   Physical Exam Updated Vital Signs BP (!) 192/97 (BP Location: Right Arm)   Pulse 73   Temp 98.1 F (36.7 C) (Oral)   Resp 16   Ht 1.702 m (5\' 7" )   Wt 111 kg   LMP 05/23/2013   SpO2 99%   BMI 38.33 kg/m  Physical Exam Vitals and nursing note reviewed.  Constitutional:      General: She is not in acute distress.    Appearance: She is well-developed.  HENT:     Head: Normocephalic and atraumatic.      Mouth/Throat:     Pharynx: No oropharyngeal exudate.  Eyes:     General: No scleral icterus.       Right eye: No discharge.        Left eye: No discharge.     Conjunctiva/sclera: Conjunctivae normal.     Pupils: Pupils are equal, round, and reactive to light.  Neck:     Thyroid: No thyromegaly.     Vascular: No JVD.  Cardiovascular:     Rate and Rhythm: Normal rate and regular rhythm.     Heart sounds: Normal heart sounds. No murmur heard.    No friction rub. No gallop.  Pulmonary:     Effort: Pulmonary effort is normal. No respiratory distress.     Breath sounds: Normal breath sounds. No wheezing or rales.  Abdominal:     General: Bowel sounds are normal. There is no distension.     Palpations: Abdomen is soft. There is no mass.     Tenderness: There is no abdominal tenderness.  Musculoskeletal:        General: No tenderness. Normal range of motion.     Cervical back: Normal range of motion and neck supple.     Right  lower leg: No edema.     Left lower leg: No edema.  Lymphadenopathy:     Cervical: No cervical adenopathy.  Skin:    General: Skin is warm and dry.     Findings: No erythema or rash.  Neurological:     Mental Status: She is alert.     Coordination: Coordination normal.  Psychiatric:        Behavior: Behavior normal.     ED Results / Procedures / Treatments   Labs (all labs ordered are listed, but only abnormal results are displayed) Labs Reviewed - No data to display  EKG None  Radiology DG Chest 2 View  Result Date: 04/19/2022 CLINICAL DATA:  Swallowed foreign body. EXAM: CHEST - 2 VIEW COMPARISON:  March 14, 2019 FINDINGS: The heart size and mediastinal contours are within normal limits. Low lung volumes are noted with mild atelectasis seen within the bilateral lung bases. There is no evidence of a pleural effusion or pneumothorax. No radiopaque foreign bodies are identified. Multilevel degenerative changes seen throughout the thoracic spine.  IMPRESSION: Low lung volumes with mild bibasilar atelectasis. Electronically Signed   By: Aram Candela M.D.   On: 04/19/2022 20:08    Procedures Procedures    Medications Ordered in ED Medications - No data to display  ED Course/ Medical Decision Making/ A&P                           Medical Decision Making Amount and/or Complexity of Data Reviewed Radiology: ordered.   This patient presents to the ED for concern of foreign body sensation in the mid chest differential diagnosis includes obstruction of the esophagus versus abrasion of the esophagus, she is drinking freely suggesting that there is no residual obstruction    Additional history obtained:  Additional history obtained from electronic medical record External records from outside source obtained and reviewed including prior procedures, no EGD performed   Lab Tests:  I Ordered, and personally interpreted labs.  The pertinent results include: No labs indicated   Imaging Studies ordered:  I ordered imaging studies including 2 view chest x-ray I independently visualized and interpreted imaging which showed unremarkable, no signs of free air or mediastinal abnormalities or foreign bodies I agree with the radiologist interpretation   Medicines ordered and prescription drug management:  The patient can go home and take her blood pressure medicines, it is spontaneously improving Chest x-ray shows no foreign body, no signs of perforated mediastinum No fever or tachycardia, patient appears very stable for discharge I have reviewed the patients home medicines and have made adjustments as needed   Problem List / ED Course:  As above   Social Determinants of Health:  none           Final Clinical Impression(s) / ED Diagnoses Final diagnoses:  Globus sensation    Rx / DC Orders ED Discharge Orders     None         Eber Hong, MD 04/19/22 2134

## 2022-04-19 NOTE — Discharge Instructions (Addendum)
If you continue to have this feeling of a foreign body in your throat you will need to see the GI specialist.  Please see the phone number above.  Everything looks normal on your x-ray.  I do want you to continue to take your blood pressure medication exactly as prescribed, come back to the ER for severe worsening symptoms  Please take a clear liquid diet for 24 hours then slowly progress your diet into soft foods

## 2022-04-19 NOTE — ED Triage Notes (Signed)
POV from home. Cc of feeling like there is food stuck in her throat. Says she had some of her roll and stuffing and can feel in her throat. Able to tolerate drinking liquids but feels like it is still here.

## 2022-04-24 ENCOUNTER — Encounter: Payer: Self-pay | Admitting: Cardiology

## 2022-04-24 ENCOUNTER — Ambulatory Visit: Payer: Self-pay | Attending: Cardiology | Admitting: Cardiology

## 2022-04-24 VITALS — BP 138/80 | HR 82 | Ht 67.0 in | Wt 240.8 lb

## 2022-04-24 DIAGNOSIS — I480 Paroxysmal atrial fibrillation: Secondary | ICD-10-CM

## 2022-04-24 DIAGNOSIS — I10 Essential (primary) hypertension: Secondary | ICD-10-CM

## 2022-04-24 DIAGNOSIS — Z79899 Other long term (current) drug therapy: Secondary | ICD-10-CM

## 2022-04-24 MED ORDER — METOPROLOL TARTRATE 100 MG PO TABS: 100.0000 mg | ORAL_TABLET | Freq: Two times a day (BID) | ORAL | 3 refills | Status: DC

## 2022-04-24 MED ORDER — LOSARTAN POTASSIUM 25 MG PO TABS: 25.0000 mg | ORAL_TABLET | Freq: Every day | ORAL | 3 refills | Status: DC

## 2022-04-24 NOTE — Patient Instructions (Addendum)
Medication Instructions:  Losartan & Metoprolol refilled today Continue all other medications.     Labwork: BMET - order given today  Office will contact with results via phone, letter or mychart.     Testing/Procedures: none  Follow-Up: 6 months   Any Other Special Instructions Will Be Listed Below (If Applicable).   If you need a refill on your cardiac medications before your next appointment, please call your pharmacy.

## 2022-04-24 NOTE — Progress Notes (Signed)
Clinical Summary Ms. Lagace is a 56 y.o.female seen today for follow up of the following medical problems   1. Paroxysmal Afib - Seen in ER with chest pain and palpitations.  - she reports high caffeine intake - found to be in afib with RVR in ER. Converted back to NSR after IV cardizem. Toprol XL was increased to 100mg  dialy.    - patient changed from eliquis to coumadin due to cost - around the time of starting coumadin also started norvasc. Noted significant hair loss, stopped both medicines and hair loss resolved. Currently just taking ASA. CHADSVasc score is 2.      - no recent palpitations - compliant with meds        2. HTN - does not check bps at home.  - compliantw with meds   - unclear if norvasc contributed to hair loss - possible hair loss  -last visit started losartan 25mg  daily.  - did not get labs since last visit    3. Hair loss - hair loss started when starting coumadin and norvasc around the same time, she stopped both meds and hair loss resolved.  Past Medical History:  Diagnosis Date   Atrial fibrillation (HCC)    Back pain    Diverticulitis    Pneumonia    SVT (supraventricular tachycardia)      No Known Allergies   Current Outpatient Medications  Medication Sig Dispense Refill   HYDROcodone-acetaminophen (NORCO) 7.5-325 MG tablet Take 1 tablet by mouth every 6 (six) hours as needed for moderate pain (Must last 30 days). 40 tablet 0   losartan (COZAAR) 25 MG tablet Take 1 tablet (25 mg total) by mouth daily. 30 tablet 6   metoprolol tartrate (LOPRESSOR) 100 MG tablet TAKE 1 TABLET(100 MG) BY MOUTH TWICE DAILY 60 tablet 3   No current facility-administered medications for this visit.     Past Surgical History:  Procedure Laterality Date   CHOLECYSTECTOMY     TUBAL LIGATION       No Known Allergies    Family History  Problem Relation Age of Onset   Heart disease Father    Atrial fibrillation Maternal Grandmother     Heart attack Paternal Grandfather      Social History Ms. Bango reports that she has been smoking cigarettes. She started smoking about 28 years ago. She has been smoking an average of 1 pack per day. She has never used smokeless tobacco. Ms. Salado reports no history of alcohol use.   Review of Systems CONSTITUTIONAL: No weight loss, fever, chills, weakness or fatigue.  HEENT: Eyes: No visual loss, blurred vision, double vision or yellow sclerae.No hearing loss, sneezing, congestion, runny nose or sore throat.  SKIN: No rash or itching.  CARDIOVASCULAR: per hpi RESPIRATORY: No shortness of breath, cough or sputum.  GASTROINTESTINAL: No anorexia, nausea, vomiting or diarrhea. No abdominal pain or blood.  GENITOURINARY: No burning on urination, no polyuria NEUROLOGICAL: No headache, dizziness, syncope, paralysis, ataxia, numbness or tingling in the extremities. No change in bowel or bladder control.  MUSCULOSKELETAL: No muscle, back pain, joint pain or stiffness.  LYMPHATICS: No enlarged nodes. No history of splenectomy.  PSYCHIATRIC: No history of depression or anxiety.  ENDOCRINOLOGIC: No reports of sweating, cold or heat intolerance. No polyuria or polydipsia.  Christia Reading   Physical Examination Today's Vitals   04/24/22 1428  BP: 138/80  Pulse: 82  SpO2: 98%  Weight: 240 lb 12.8 oz (109.2 kg)  Height: 5'  7" (1.702 m)   Body mass index is 37.71 kg/m.  Gen: resting comfortably, no acute distress HEENT: no scleral icterus, pupils equal round and reactive, no palptable cervical adenopathy,  CV: RRR, 2/6 systolic murmur rusb, no jvd Resp: Clear to auscultation bilaterally GI: abdomen is soft, non-tender, non-distended, normal bowel sounds, no hepatosplenomegaly MSK: extremities are warm, no edema.  Skin: warm, no rash Neuro:  no focal deficits Psych: appropriate affect      Assessment and Plan   1. PAF - CHADS2Vasc score is 2 (HTN, gender) - eliquis is too expensive, she  does not want to restart coumadin - CHAD2Vasc score of 2 in a woman recommendation for anticoag only a 2B, reasonable not to be on at this time. Discussed if insurance status changes or willing to retry coumadin let us know.  - we will continue current meds     2. HTN - elevated in clinic.  -possibly hair loss on norvasc, unclear - reorder bmet, if stable Cr and K would increase losartan.      F/u 6 months         Antoine Poche, M.D.

## 2022-05-07 ENCOUNTER — Telehealth: Payer: Self-pay | Admitting: Orthopaedic Surgery

## 2022-05-08 MED ORDER — HYDROCODONE-ACETAMINOPHEN 7.5-325 MG PO TABS: 1.0000 | ORAL_TABLET | Freq: Four times a day (QID) | ORAL | 0 refills | Status: DC | PRN

## 2022-06-10 ENCOUNTER — Encounter: Payer: Self-pay | Admitting: Orthopaedic Surgery

## 2022-06-10 ENCOUNTER — Ambulatory Visit (INDEPENDENT_AMBULATORY_CARE_PROVIDER_SITE_OTHER): Payer: Self-pay | Admitting: Orthopaedic Surgery

## 2022-06-10 VITALS — BP 200/120 | HR 68

## 2022-06-10 DIAGNOSIS — M545 Low back pain, unspecified: Secondary | ICD-10-CM

## 2022-06-10 DIAGNOSIS — G8929 Other chronic pain: Secondary | ICD-10-CM

## 2022-06-10 DIAGNOSIS — F17210 Nicotine dependence, cigarettes, uncomplicated: Secondary | ICD-10-CM

## 2022-06-10 MED ORDER — CELECOXIB 200 MG PO CAPS: 200.0000 mg | ORAL_CAPSULE | Freq: Two times a day (BID) | ORAL | 5 refills | Status: DC

## 2022-06-10 MED ORDER — HYDROCODONE-ACETAMINOPHEN 7.5-325 MG PO TABS: 1.0000 | ORAL_TABLET | Freq: Four times a day (QID) | ORAL | 0 refills | Status: DC | PRN

## 2022-06-10 NOTE — Progress Notes (Signed)
My back is tender at times.  She has good and bad days with the back.  She has no new trauma.  She has no weakness.  She wants to get back on her Celebrex and I will do so.  Her blood pressure is 200/120 but she saw her doctor last week and said it was OK.  Spine/Pelvis examination:  Inspection:  Overall, sacoiliac joint benign and hips nontender; without crepitus or defects.   Thoracic spine inspection: Alignment normal without kyphosis present   Lumbar spine inspection:  Alignment  with normal lumbar lordosis, without scoliosis apparent.   Thoracic spine palpation:  without tenderness of spinal processes   Lumbar spine palpation: without tenderness of lumbar area; without tightness of lumbar muscles    Range of Motion:   Lumbar flexion, forward flexion is normal without pain or tenderness    Lumbar extension is full without pain or tenderness   Left lateral bend is normal without pain or tenderness   Right lateral bend is normal without pain or tenderness   Straight leg raising is normal  Strength & tone: normal   Stability overall normal stability  Encounter Diagnoses  Name Primary?   Chronic midline low back pain without sciatica Yes   Cigarette nicotine dependence without complication    I have reviewed the [place] web site prior to prescribing narcotic medicine for this patient.  I have given Rx for Celebrex.  Continue exercises.    Call if any problem.  Precautions discussed.  Return in three months.  Electronically Signed Sanjuana Kava, MD 1/16/20241:42 PM

## 2022-06-10 NOTE — Patient Instructions (Signed)
As the weather changes and gets cooler, you may notice you are affected more. You may have more pain in your joints. This is normal. Dress warmly and make sure that area is covered well.   

## 2022-07-08 ENCOUNTER — Telehealth: Payer: Self-pay | Admitting: Orthopaedic Surgery

## 2022-07-09 MED ORDER — HYDROCODONE-ACETAMINOPHEN 7.5-325 MG PO TABS: 1.0000 | ORAL_TABLET | Freq: Four times a day (QID) | ORAL | 0 refills | Status: DC | PRN

## 2022-07-24 ENCOUNTER — Encounter: Payer: Self-pay | Admitting: Radiology

## 2022-08-06 ENCOUNTER — Telehealth: Payer: Self-pay | Admitting: Orthopaedic Surgery

## 2022-08-08 MED ORDER — HYDROCODONE-ACETAMINOPHEN 7.5-325 MG PO TABS: 1.0000 | ORAL_TABLET | Freq: Four times a day (QID) | ORAL | 0 refills | Status: DC | PRN

## 2022-09-09 ENCOUNTER — Ambulatory Visit (INDEPENDENT_AMBULATORY_CARE_PROVIDER_SITE_OTHER): Payer: Self-pay | Admitting: Orthopaedic Surgery

## 2022-09-09 ENCOUNTER — Encounter: Payer: Self-pay | Admitting: Orthopaedic Surgery

## 2022-09-09 VITALS — BP 171/91 | HR 74 | Ht 67.0 in | Wt 245.0 lb

## 2022-09-09 DIAGNOSIS — G8929 Other chronic pain: Secondary | ICD-10-CM

## 2022-09-09 DIAGNOSIS — F17210 Nicotine dependence, cigarettes, uncomplicated: Secondary | ICD-10-CM

## 2022-09-09 DIAGNOSIS — M545 Low back pain, unspecified: Secondary | ICD-10-CM

## 2022-09-09 MED ORDER — HYDROCODONE-ACETAMINOPHEN 7.5-325 MG PO TABS: 1.0000 | ORAL_TABLET | Freq: Four times a day (QID) | ORAL | 0 refills | Status: DC | PRN

## 2022-09-09 NOTE — Progress Notes (Signed)
My back hurts.  She has good and bad days with her back.  She is better this week. She has no new trauma, no weakness.  She is taking her Celebrex.  Spine/Pelvis examination:  Inspection:  Overall, sacoiliac joint benign and hips nontender; without crepitus or defects.   Thoracic spine inspection: Alignment normal without kyphosis present   Lumbar spine inspection:  Alignment  with normal lumbar lordosis, without scoliosis apparent.   Thoracic spine palpation:  without tenderness of spinal processes   Lumbar spine palpation: without tenderness of lumbar area; without tightness of lumbar muscles    Range of Motion:   Lumbar flexion, forward flexion is normal without pain or tenderness    Lumbar extension is full without pain or tenderness   Left lateral bend is normal without pain or tenderness   Right lateral bend is normal without pain or tenderness   Straight leg raising is normal  Strength & tone: normal   Stability overall normal stability  Encounter Diagnoses  Name Primary?   Chronic midline low back pain without sciatica Yes   Cigarette nicotine dependence without complication    I have reviewed the [place] Controlled Substance Reporting System web site prior to prescribing narcotic medicine for this patient.  Return in three months.  Call if any problem.  Precautions discussed.  Electronically Signed Darreld Mclean, MD 4/16/20242:09 PM

## 2022-10-03 ENCOUNTER — Telehealth: Payer: Self-pay | Admitting: Orthopaedic Surgery

## 2022-10-07 MED ORDER — HYDROCODONE-ACETAMINOPHEN 7.5-325 MG PO TABS: 1.0000 | ORAL_TABLET | Freq: Four times a day (QID) | ORAL | 0 refills | Status: DC | PRN

## 2022-10-24 LAB — BASIC METABOLIC PANEL
BUN/Creatinine Ratio: 30 — ABNORMAL HIGH (ref 9–23)
BUN: 20 mg/dL (ref 6–24)
CO2: 26 mmol/L (ref 20–29)
Calcium: 9.6 mg/dL (ref 8.7–10.2)
Chloride: 103 mmol/L (ref 96–106)
Creatinine, Ser: 0.67 mg/dL (ref 0.57–1.00)
Glucose: 91 mg/dL (ref 70–99)
Potassium: 4.9 mmol/L (ref 3.5–5.2)
Sodium: 144 mmol/L (ref 134–144)
eGFR: 101 mL/min/{1.73_m2} (ref >59)

## 2022-10-30 ENCOUNTER — Encounter: Payer: Self-pay | Admitting: Cardiology

## 2022-10-30 ENCOUNTER — Encounter: Payer: Self-pay | Admitting: *Deleted

## 2022-10-30 ENCOUNTER — Ambulatory Visit: Payer: Self-pay | Attending: Cardiology | Admitting: Cardiology

## 2022-10-30 VITALS — BP 154/82 | HR 75 | Ht 67.0 in | Wt 247.0 lb

## 2022-10-30 DIAGNOSIS — I480 Paroxysmal atrial fibrillation: Secondary | ICD-10-CM | POA: Insufficient documentation

## 2022-10-30 DIAGNOSIS — Z79899 Other long term (current) drug therapy: Secondary | ICD-10-CM

## 2022-10-30 MED ORDER — LOSARTAN POTASSIUM 50 MG PO TABS: 50.0000 mg | ORAL_TABLET | Freq: Every day | ORAL | 6 refills | Status: DC

## 2022-10-30 NOTE — Patient Instructions (Addendum)
Medication Instructions:  Your physician has recommended you make the following change in your medication:  Increase losartan to 50 mg daily Continue all other medications the same  Labwork: BMET in 2 weeks (11/13/2022) @[facility] ([address] [place])  Testing/Procedures: none  Follow-Up: Your physician recommends that you schedule a follow-up appointment in: 6 months  Any Other Special Instructions Will Be Listed Below (If Applicable). You have been referred to [facility] Your physician has requested that you regularly monitor and record your blood pressure readings at home. Please use the same machine at the same time of day to check your readings and record them. Update office in 2 weeks with your readings by phone or mychart.  If you need a refill on your cardiac medications before your next appointment, please call your pharmacy.

## 2022-10-30 NOTE — Progress Notes (Signed)
Clinical Summary Wendy Ramirez is a 57 y.o.female seen today for follow up of the following medical problems   1. Paroxysmal Afib - Seen in ER with chest pain and palpitations.  - she reports high caffeine intake - found to be in afib with RVR in ER. Converted back to NSR after IV cardizem. Toprol XL was increased to 100mg  dialy.    - patient changed from eliquis to coumadin due to cost - around the time of starting coumadin also started norvasc. Noted significant hair loss, stopped both medicines and hair loss resolved. Currently just taking ASA. CHADSVasc score is 2.        - no palpitaitons - compliant with meds         2. HTN - compliant with meds - home cuff 140s/80s, occasionally 160s     3. Hair loss - hair loss started when starting coumadin and norvasc around the same time, she stopped both meds and hair loss resolved.    Just got back from [place] Past Medical History:  Diagnosis Date   Atrial fibrillation (HCC)    Back pain    Diverticulitis    Pneumonia    SVT (supraventricular tachycardia)      No Known Allergies   Current Outpatient Medications  Medication Sig Dispense Refill   celecoxib (CELEBREX) 200 MG capsule Take 1 capsule (200 mg total) by mouth 2 (two) times daily. 60 capsule 5   HYDROcodone-acetaminophen (NORCO) 7.5-325 MG tablet Take 1 tablet by mouth every 6 (six) hours as needed for moderate pain (Must last 30 days). 40 tablet 0   losartan (COZAAR) 25 MG tablet Take 1 tablet (25 mg total) by mouth daily. 90 tablet 3   metoprolol tartrate (LOPRESSOR) 100 MG tablet Take 1 tablet (100 mg total) by mouth 2 (two) times daily. 180 tablet 3   No current facility-administered medications for this visit.     Past Surgical History:  Procedure Laterality Date   CHOLECYSTECTOMY     TUBAL LIGATION       No Known Allergies    Family History  Problem Relation Age of Onset   Heart disease Father    Atrial fibrillation  Maternal Grandmother    Heart attack Paternal Grandfather      Social History Wendy Ramirez reports that she has been smoking cigarettes. She started smoking about 29 years ago. She has been smoking an average of 1 pack per day. She has never used smokeless tobacco. Wendy Ramirez reports no history of alcohol use.   Review of Systems CONSTITUTIONAL: No weight loss, fever, chills, weakness or fatigue.  HEENT: Eyes: No visual loss, blurred vision, double vision or yellow sclerae.No hearing loss, sneezing, congestion, runny nose or sore throat.  SKIN: No rash or itching.  CARDIOVASCULAR: per hpi RESPIRATORY: No shortness of breath, cough or sputum.  GASTROINTESTINAL: No anorexia, nausea, vomiting or diarrhea. No abdominal pain or blood.  GENITOURINARY: No burning on urination, no polyuria NEUROLOGICAL: No headache, dizziness, syncope, paralysis, ataxia, numbness or tingling in the extremities. No change in bowel or bladder control.  MUSCULOSKELETAL: No muscle, back pain, joint pain or stiffness.  LYMPHATICS: No enlarged nodes. No history of splenectomy.  PSYCHIATRIC: No history of depression or anxiety.  ENDOCRINOLOGIC: No reports of sweating, cold or heat intolerance. No polyuria or polydipsia.  Marland Kitchen   Physical Examination Today's Vitals   10/30/22 1450 10/30/22 1520  BP: (!) 184/120 (!) 154/82  Pulse: 75   SpO2:  93%   Weight: 247 lb (112 kg)   Height: 5\' 7"  (1.702 m)    Body mass index is 38.69 kg/m.  Gen: resting comfortably, no acute distress HEENT: no scleral icterus, pupils equal round and reactive, no palptable cervical adenopathy,  CV: RRR, no m/rg, no jvd Resp: Clear to auscultation bilaterally GI: abdomen is soft, non-tender, non-distended, normal bowel sounds, no hepatosplenomegaly MSK: extremities are warm, no edema.  Skin: warm, no rash Neuro:  no focal deficits Psych: appropriate affect   Diagnostic Studies     Assessment and Plan   1. PAF - CHADS2Vasc score  is 2 (HTN, gender) - eliquis is too expensive. Willing to retry coumadin, unclear if prior hair loss was related to it or norvasc - refer to coumadin clinic.  - EKG today shows NSR     2. HTN -possibly hair loss on norvasc, unclear -manual recheck 154/82 - she checked with her home cuff right after, it was accurate - increase losartan to 50mg  daily, check bmet 2 weeks  No pcp, given list today. Given list of annual labs she will check on cost, we may order at f/u for her if still no pcp pending cost    Antoine Poche, M.D.

## 2022-11-05 ENCOUNTER — Telehealth: Payer: Self-pay | Admitting: Orthopaedic Surgery

## 2022-11-05 MED ORDER — HYDROCODONE-ACETAMINOPHEN 7.5-325 MG PO TABS: 1.0000 | ORAL_TABLET | Freq: Four times a day (QID) | ORAL | 0 refills | Status: DC | PRN

## 2022-11-10 ENCOUNTER — Encounter: Payer: Self-pay | Admitting: *Deleted

## 2022-12-01 ENCOUNTER — Encounter: Payer: Self-pay | Admitting: Cardiology

## 2022-12-01 NOTE — Telephone Encounter (Signed)
error 

## 2022-12-02 LAB — BASIC METABOLIC PANEL
BUN/Creatinine Ratio: 25 — ABNORMAL HIGH (ref 9–23)
BUN: 15 mg/dL (ref 6–24)
CO2: 22 mmol/L (ref 20–29)
Calcium: 9.2 mg/dL (ref 8.7–10.2)
Chloride: 105 mmol/L (ref 96–106)
Creatinine, Ser: 0.61 mg/dL (ref 0.57–1.00)
Glucose: 99 mg/dL (ref 70–99)
Potassium: 4.6 mmol/L (ref 3.5–5.2)
Sodium: 142 mmol/L (ref 134–144)
eGFR: 103 mL/min/{1.73_m2} (ref >59)

## 2022-12-09 ENCOUNTER — Ambulatory Visit (INDEPENDENT_AMBULATORY_CARE_PROVIDER_SITE_OTHER): Payer: Self-pay | Admitting: Orthopaedic Surgery

## 2022-12-09 ENCOUNTER — Encounter: Payer: Self-pay | Admitting: Orthopaedic Surgery

## 2022-12-09 VITALS — BP 147/78 | HR 71 | Ht 67.0 in | Wt 250.0 lb

## 2022-12-09 DIAGNOSIS — M545 Low back pain, unspecified: Secondary | ICD-10-CM

## 2022-12-09 DIAGNOSIS — F17210 Nicotine dependence, cigarettes, uncomplicated: Secondary | ICD-10-CM

## 2022-12-09 DIAGNOSIS — G8929 Other chronic pain: Secondary | ICD-10-CM

## 2022-12-09 MED ORDER — HYDROCODONE-ACETAMINOPHEN 7.5-325 MG PO TABS: 1.0000 | ORAL_TABLET | Freq: Four times a day (QID) | ORAL | 0 refills | Status: DC | PRN

## 2022-12-09 NOTE — Progress Notes (Signed)
I am the same.  She has good and bad days, more good days in the summer.  She has no weakness.  She is not taking the Celebrex.  She has to talk to her other doctor about this.  Spine/Pelvis examination:  Inspection:  Overall, sacoiliac joint benign and hips nontender; without crepitus or defects.   Thoracic spine inspection: Alignment normal without kyphosis present   Lumbar spine inspection:  Alignment  with normal lumbar lordosis, without scoliosis apparent.   Thoracic spine palpation:  without tenderness of spinal processes   Lumbar spine palpation: without tenderness of lumbar area; without tightness of lumbar muscles    Range of Motion:   Lumbar flexion, forward flexion is normal without pain or tenderness    Lumbar extension is full without pain or tenderness   Left lateral bend is normal without pain or tenderness   Right lateral bend is normal without pain or tenderness   Straight leg raising is normal  Strength & tone: normal   Stability overall normal stability  Encounter Diagnoses  Name Primary?   Chronic midline low back pain without sciatica Yes   Cigarette nicotine dependence without complication    I have reviewed the [place] Controlled Substance Reporting System web site prior to prescribing narcotic medicine for this patient.  Return in three months.  Call if any problem.  Precautions discussed.  Electronically Signed Darreld Mclean, MD 7/16/20241:32 PM

## 2022-12-09 NOTE — Patient Instructions (Signed)
Smoking Tobacco Information, Adult Smoking tobacco can be harmful to your health. Tobacco contains a toxic colorless chemical called nicotine. Nicotine causes changes in your brain that make you want more and more. This is called addiction. This can make it hard to stop smoking once you start. Tobacco also has other toxic chemicals that can hurt your body and raise your risk of many cancers. Menthol or "lite" tobacco or cigarette brands are not safer than regular brands. How can smoking tobacco affect me? Smoking tobacco puts you at risk for: Cancer. Smoking is most commonly associated with lung cancer, but can also lead to cancer in other parts of the body. Chronic obstructive pulmonary disease (COPD). This is a long-term lung condition that makes it hard to breathe. It also gets worse over time. High blood pressure (hypertension), heart disease, stroke, heart attack, and lung infections, such as pneumonia. Cataracts. This is when the lenses in the eyes become clouded. Digestive problems. This may include peptic ulcers, heartburn, and gastroesophageal reflux disease (GERD). Oral health problems, such as gum disease, mouth sores, and tooth loss. Loss of taste and smell. Smoking also affects how you look and smell. Smoking may cause: Wrinkles. Yellow or stained teeth, fingers, and fingernails. Bad breath. Bad-smelling clothes and hair. Smoking tobacco can also affect your social life, because: It may be challenging to find places to smoke when away from home. Many workplaces, restaurants, hotels, and public places are tobacco-free. Smoking is expensive. This is due to the cost of tobacco and the long-term costs of treating health problems from smoking. Secondhand smoke may affect those around you. Secondhand smoke can cause lung cancer, breathing problems, and heart disease. Children of smokers have a higher risk for: Sudden infant death syndrome (SIDS). Ear infections. Lung infections. What  actions can I take to prevent health problems? Quit smoking  Do not start smoking. Quit if you already smoke. Do not replace cigarette smoking with vaping devices, such as e-cigarettes. Make a plan to quit smoking and commit to it. Look for programs to help you, and ask your health care provider for recommendations and ideas. Set a date and write down all the reasons you want to quit. Let your friends and family know you are quitting so they can help and support you. Consider finding friends who also want to quit. It can be easier to quit with someone else, so that you can support each other. Talk with your health care provider about using nicotine replacement medicines to help you quit. These include gum, lozenges, patches, sprays, or pills. If you try to quit but return to smoking, stay positive. It is common to slip up when you first quit, so take it one day at a time. Be prepared for cravings. When you feel the urge to smoke, chew gum or suck on hard candy. Lifestyle Stay busy. Take care of your body. Get plenty of exercise, eat a healthy diet, and drink plenty of water. Find ways to manage your stress, such as meditation, yoga, exercise, or time spent with friends and family. Ask your health care provider about having regular tests (screenings) to check for cancer. This may include blood tests, imaging tests, and other tests. Where to find support To get support to quit smoking, consider: Asking your health care provider for more information and resources. Joining a support group for people who want to quit smoking in your local community. There are many effective programs that may help you to quit. Calling the smokefree.gov counselor   helpline at 1-800-QUIT-NOW (1-800-784-8669). Where to find more information You may find more information about quitting smoking from: Centers for Disease Control and Prevention: cdc.gov/tobacco Smokefree.gov: smokefree.gov American Lung Association:  freedomfromsmoking.org Contact a health care provider if: You have problems breathing. Your lips, nose, or fingers turn blue. You have chest pain. You are coughing up blood. You feel like you will faint. You have other health changes that cause you to worry. Summary Smoking tobacco can negatively affect your health, the health of those around you, your finances, and your social life. Do not start smoking. Quit if you already smoke. If you need help quitting, ask your health care provider. Consider joining a support group for people in your local community who want to quit smoking. There are many effective programs that may help you to quit. This information is not intended to replace advice given to you by your health care provider. Make sure you discuss any questions you have with your health care provider. Document Revised: 05/07/2021 Document Reviewed: 05/07/2021 Elsevier Patient Education  2024 Elsevier Inc.  

## 2022-12-19 ENCOUNTER — Encounter: Payer: Self-pay | Admitting: *Deleted

## 2023-01-07 ENCOUNTER — Telehealth: Payer: Self-pay | Admitting: Orthopaedic Surgery

## 2023-01-08 MED ORDER — HYDROCODONE-ACETAMINOPHEN 7.5-325 MG PO TABS: 1.0000 | ORAL_TABLET | Freq: Four times a day (QID) | ORAL | 0 refills | Status: DC | PRN

## 2023-02-09 ENCOUNTER — Telehealth: Payer: Self-pay | Admitting: Orthopaedic Surgery

## 2023-02-10 MED ORDER — HYDROCODONE-ACETAMINOPHEN 7.5-325 MG PO TABS: 1.0000 | ORAL_TABLET | Freq: Four times a day (QID) | ORAL | 0 refills | Status: DC | PRN

## 2023-03-10 ENCOUNTER — Ambulatory Visit (INDEPENDENT_AMBULATORY_CARE_PROVIDER_SITE_OTHER): Payer: Self-pay | Admitting: Orthopaedic Surgery

## 2023-03-10 ENCOUNTER — Encounter: Payer: Self-pay | Admitting: Orthopaedic Surgery

## 2023-03-10 VITALS — BP 174/97 | HR 75 | Ht 67.0 in | Wt 240.0 lb

## 2023-03-10 DIAGNOSIS — M545 Low back pain, unspecified: Secondary | ICD-10-CM

## 2023-03-10 DIAGNOSIS — F17210 Nicotine dependence, cigarettes, uncomplicated: Secondary | ICD-10-CM

## 2023-03-10 DIAGNOSIS — G8929 Other chronic pain: Secondary | ICD-10-CM

## 2023-03-10 MED ORDER — HYDROCODONE-ACETAMINOPHEN 7.5-325 MG PO TABS: 1.0000 | ORAL_TABLET | Freq: Four times a day (QID) | ORAL | 0 refills | Status: DC | PRN

## 2023-03-10 NOTE — Progress Notes (Signed)
My back is the same.  She has chronic pain of the lower back. Her pain is good some days and other days she has much more pain.  She has no new trauma.  She is active, and does her exercises.  She has no weakness or numbness.  She is taking her medicine.  Spine/Pelvis examination:  Inspection:  Overall, sacoiliac joint benign and hips nontender; without crepitus or defects.   Thoracic spine inspection: Alignment normal without kyphosis present   Lumbar spine inspection:  Alignment  with normal lumbar lordosis, without scoliosis apparent.   Thoracic spine palpation:  without tenderness of spinal processes   Lumbar spine palpation: without tenderness of lumbar area; without tightness of lumbar muscles    Range of Motion:   Lumbar flexion, forward flexion is normal without pain or tenderness    Lumbar extension is full without pain or tenderness   Left lateral bend is normal without pain or tenderness   Right lateral bend is normal without pain or tenderness   Straight leg raising is normal  Strength & tone: normal   Stability overall normal stability  Encounter Diagnoses  Name Primary?   Chronic midline low back pain without sciatica Yes   Cigarette nicotine dependence without complication    Return in three months.  I have reviewed the [place] Controlled Substance Reporting System web site prior to prescribing narcotic medicine for this patient.  Call if any problem.  Precautions discussed.  Electronically Signed Darreld Mclean, MD 10/15/20241:37 PM

## 2023-04-06 ENCOUNTER — Telehealth: Payer: Self-pay | Admitting: Orthopaedic Surgery

## 2023-04-07 MED ORDER — HYDROCODONE-ACETAMINOPHEN 7.5-325 MG PO TABS: 1.0000 | ORAL_TABLET | Freq: Four times a day (QID) | ORAL | 0 refills | Status: DC | PRN

## 2023-05-05 ENCOUNTER — Ambulatory Visit: Payer: Self-pay | Admitting: Cardiology

## 2023-05-06 ENCOUNTER — Telehealth: Payer: Self-pay | Admitting: Orthopaedic Surgery

## 2023-05-07 MED ORDER — HYDROCODONE-ACETAMINOPHEN 7.5-325 MG PO TABS: 1.0000 | ORAL_TABLET | Freq: Four times a day (QID) | ORAL | 0 refills | Status: DC | PRN

## 2023-05-08 ENCOUNTER — Other Ambulatory Visit: Payer: Self-pay | Admitting: Cardiology

## 2023-06-01 ENCOUNTER — Telehealth: Payer: Self-pay | Admitting: Cardiology

## 2023-06-01 MED ORDER — LOSARTAN POTASSIUM 50 MG PO TABS: 50.0000 mg | ORAL_TABLET | Freq: Every day | ORAL | 0 refills | Status: DC

## 2023-06-01 NOTE — Telephone Encounter (Signed)
Refilled. Patient needs appointment

## 2023-06-01 NOTE — Telephone Encounter (Signed)
*  STAT* If patient is at the pharmacy, call can be transferred to refill team.   1. Which medications need to be refilled? (please list name of each medication and dose if known) losartan  (COZAAR ) 50 MG tablet   2. Which pharmacy/location (including street and city if local pharmacy) is medication to be sent to? [id number] - [place], [place] - [address]   3. Do they need a 30 day or 90 day supply? 30 day

## 2023-06-08 ENCOUNTER — Telehealth: Payer: Self-pay | Admitting: Orthopaedic Surgery

## 2023-06-09 ENCOUNTER — Ambulatory Visit: Payer: Self-pay | Admitting: Orthopaedic Surgery

## 2023-06-09 MED ORDER — HYDROCODONE-ACETAMINOPHEN 7.5-325 MG PO TABS: 1.0000 | ORAL_TABLET | Freq: Four times a day (QID) | ORAL | 0 refills | Status: DC | PRN

## 2023-06-10 ENCOUNTER — Encounter: Payer: Self-pay | Admitting: Orthopaedic Surgery

## 2023-06-10 ENCOUNTER — Ambulatory Visit (INDEPENDENT_AMBULATORY_CARE_PROVIDER_SITE_OTHER): Payer: Self-pay | Admitting: Orthopaedic Surgery

## 2023-06-10 VITALS — BP 188/108 | HR 108

## 2023-06-10 DIAGNOSIS — F17210 Nicotine dependence, cigarettes, uncomplicated: Secondary | ICD-10-CM

## 2023-06-10 DIAGNOSIS — M545 Low back pain, unspecified: Secondary | ICD-10-CM

## 2023-06-10 DIAGNOSIS — G8929 Other chronic pain: Secondary | ICD-10-CM

## 2023-06-10 NOTE — Progress Notes (Signed)
 My back is sore.  She fell on the ice last week.  Her back is more tender than normal.  She is taking her medicine and being active.  ROM of the back is good but tender, NV intact, muscle tone and strength are normal, gait normal.  Encounter Diagnoses  Name Primary?   Chronic midline low back pain without sciatica Yes   Cigarette nicotine dependence without complication    I had refilled her medicine yesterday.  Return in three months.  Call if any problem.  Precautions discussed.  Electronically Signed Pleasant Brilliant, MD 1/15/20252:04 PM

## 2023-06-12 ENCOUNTER — Ambulatory Visit: Payer: Self-pay | Admitting: Nurse Practitioner

## 2023-06-24 ENCOUNTER — Telehealth: Payer: Self-pay | Admitting: Cardiology

## 2023-06-24 MED ORDER — LOSARTAN POTASSIUM 50 MG PO TABS: 50.0000 mg | ORAL_TABLET | Freq: Every day | ORAL | 0 refills | Status: DC

## 2023-06-24 NOTE — Telephone Encounter (Signed)
Has apt in February 90 day supply given

## 2023-06-24 NOTE — Telephone Encounter (Signed)
*  STAT* If patient is at the pharmacy, call can be transferred to refill team.   1. Which medications need to be refilled? (please list name of each medication and dose if known) losartan (COZAAR) 50 MG tablet    2. Would you like to learn more about the convenience, safety, & potential cost savings by using the [facility]? No   3. Are you open to using the [facility] (Type [facility]. No   4. Which pharmacy/location (including street and city if local pharmacy) is medication to be sent to?  [id number] - [place], [place] - [address]     5. Do they need a 30 day or 90 day supply? 90 day  Is completely out of medications and is requesting a 90 day instead of a 30 day.

## 2023-07-08 ENCOUNTER — Telehealth: Payer: Self-pay | Admitting: Orthopaedic Surgery

## 2023-07-09 MED ORDER — HYDROCODONE-ACETAMINOPHEN 7.5-325 MG PO TABS: 1.0000 | ORAL_TABLET | Freq: Four times a day (QID) | ORAL | 0 refills | Status: DC | PRN

## 2023-07-24 ENCOUNTER — Ambulatory Visit: Payer: Self-pay | Admitting: Nurse Practitioner

## 2023-08-04 ENCOUNTER — Telehealth: Payer: Self-pay | Admitting: Orthopaedic Surgery

## 2023-08-05 MED ORDER — HYDROCODONE-ACETAMINOPHEN 7.5-325 MG PO TABS: 1.0000 | ORAL_TABLET | Freq: Four times a day (QID) | ORAL | 0 refills | Status: DC | PRN

## 2023-08-24 ENCOUNTER — Other Ambulatory Visit: Payer: Self-pay | Admitting: Cardiology

## 2023-08-24 MED ORDER — METOPROLOL TARTRATE 100 MG PO TABS: 100.0000 mg | ORAL_TABLET | Freq: Two times a day (BID) | ORAL | 0 refills | Status: DC

## 2023-09-02 ENCOUNTER — Ambulatory Visit (INDEPENDENT_AMBULATORY_CARE_PROVIDER_SITE_OTHER): Payer: Self-pay | Admitting: Orthopaedic Surgery

## 2023-09-02 ENCOUNTER — Encounter: Payer: Self-pay | Admitting: Orthopaedic Surgery

## 2023-09-02 VITALS — BP 174/97 | HR 72 | Ht 67.0 in | Wt 245.0 lb

## 2023-09-02 DIAGNOSIS — G8929 Other chronic pain: Secondary | ICD-10-CM

## 2023-09-02 DIAGNOSIS — M545 Low back pain, unspecified: Secondary | ICD-10-CM

## 2023-09-02 DIAGNOSIS — F17210 Nicotine dependence, cigarettes, uncomplicated: Secondary | ICD-10-CM

## 2023-09-02 NOTE — Progress Notes (Signed)
 My back has its moments.  She has good and bad days with her lower back.  She has been active and has no trauma.  She has no weakness. She is taking her medicine.  Spine/Pelvis examination:  Inspection:  Overall, sacoiliac joint benign and hips nontender; without crepitus or defects.   Thoracic spine inspection: Alignment normal without kyphosis present   Lumbar spine inspection:  Alignment  with normal lumbar lordosis, without scoliosis apparent.   Thoracic spine palpation:  without tenderness of spinal processes   Lumbar spine palpation: without tenderness of lumbar area; without tightness of lumbar muscles    Range of Motion:   Lumbar flexion, forward flexion is normal without pain or tenderness    Lumbar extension is full without pain or tenderness   Left lateral bend is normal without pain or tenderness   Right lateral bend is normal without pain or tenderness   Straight leg raising is normal  Strength & tone: normal   Stability overall normal stability  Encounter Diagnoses  Name Primary?   Chronic midline low back pain without sciatica Yes   Cigarette nicotine dependence without complication    Return in three months.  Call if any problem.  Precautions discussed.  Electronically Signed Darreld Mclean, MD 4/9/20252:36 PM

## 2023-09-07 ENCOUNTER — Telehealth: Payer: Self-pay | Admitting: Orthopaedic Surgery

## 2023-09-07 MED ORDER — HYDROCODONE-ACETAMINOPHEN 7.5-325 MG PO TABS: 1.0000 | ORAL_TABLET | Freq: Four times a day (QID) | ORAL | 0 refills | Status: DC | PRN

## 2023-09-09 ENCOUNTER — Ambulatory Visit: Payer: Self-pay | Admitting: Orthopaedic Surgery

## 2023-09-10 ENCOUNTER — Telehealth: Payer: Self-pay | Admitting: Cardiology

## 2023-09-10 MED ORDER — LOSARTAN POTASSIUM 50 MG PO TABS: 50.0000 mg | ORAL_TABLET | Freq: Every day | ORAL | 0 refills | Status: DC

## 2023-09-10 NOTE — Telephone Encounter (Signed)
Filled until ov

## 2023-09-10 NOTE — Telephone Encounter (Signed)
*  STAT* If patient is at the pharmacy, call can be transferred to refill team.   1. Which medications need to be refilled? (please list name of each medication and dose if known) losartan (COZAAR) 50 MG tablet    2. Would you like to learn more about the convenience, safety, & potential cost savings by using the [facility]? No     3. Are you open to using the [facility] (Type [facility].  ). No   4. Which pharmacy/location (including street and city if local pharmacy) is medication to be sent to?  [id number] - [place], [place] - [address]     5. Do they need a 30 day or 90 day supply? 90 day

## 2023-09-18 ENCOUNTER — Encounter: Payer: Self-pay | Admitting: Nurse Practitioner

## 2023-09-18 ENCOUNTER — Ambulatory Visit: Payer: Self-pay | Attending: Nurse Practitioner | Admitting: Nurse Practitioner

## 2023-09-18 VITALS — BP 140/82 | HR 72 | Ht 67.0 in | Wt 249.0 lb

## 2023-09-18 DIAGNOSIS — Z79899 Other long term (current) drug therapy: Secondary | ICD-10-CM

## 2023-09-18 DIAGNOSIS — I480 Paroxysmal atrial fibrillation: Secondary | ICD-10-CM

## 2023-09-18 DIAGNOSIS — I10 Essential (primary) hypertension: Secondary | ICD-10-CM

## 2023-09-18 DIAGNOSIS — E669 Obesity, unspecified: Secondary | ICD-10-CM

## 2023-09-18 NOTE — Patient Instructions (Addendum)
 Medication Instructions:  Your physician recommends that you continue on your current medications as directed. Please refer to the Current Medication list given to you today.  Labwork: In 1 week at [facility]   Testing/Procedures: None   Follow-Up: Your physician recommends that you schedule a follow-up appointment in:  4-6 week nurse visit for Blood Pressure check  6 months   Any Other Special Instructions Will Be Listed Below (If Applicable).   If you need a refill on your cardiac medications before your next appointment, please call your pharmacy.

## 2023-09-18 NOTE — Progress Notes (Signed)
  Cardiology Office Note:  .   Date:  09/18/2023 ID:  Wendy Ramirez, DOB 20-Oct-1965, MRN [id number] PCP: Wendy Ramirez  [facility] Providers Cardiologist:  Wendy Lander, MD    History of Present Illness: .   Wendy Ramirez is a 58 y.o. female with a PMH of PAF, hypertension, and past history of hair loss, who presents today for overdue follow-up appointment.  Last seen by Dr. Armida Ramirez on October 30, 2022.  Eliquis  was started for her in the past due to chads Vascor of 2.  However, Eliquis  was too expensive, was referred to Coumadin  clinic.  She was in normal sinus rhythm on exam that day.  Losartan  was increased to 50 mg daily due to elevated BP.  Today she presents for follow-up.  She states she is overall doing well. Denies any chest pain, shortness of breath, palpitations, syncope, presyncope, dizziness, orthopnea, PND, swelling or significant weight changes, acute bleeding, or claudication.    ROS: Negative. See HPI.  SH: Takes care of 35 year old mother. Has grandchildren ranging in age from 2 -71 years old. Enjoys supporting grandchildren at cheerleading and football games.   Studies Reviewed: Wendy Ramirez    EKG: EKG is not ordered today.   Physical Exam:   VS:  BP (!) 140/82   Pulse 72   Ht 5\' 7"  (1.702 m)   Wt 249 lb (112.9 kg)   LMP 05/23/2013   SpO2 98%   BMI 39.00 kg/m    Wt Readings from Last 3 Encounters:  09/18/23 249 lb (112.9 kg)  09/02/23 245 lb (111.1 kg)  03/10/23 240 lb (108.9 kg)    GEN: Obese, 58 y.o. female in no acute distress NECK: No JVD; No carotid bruits CARDIAC: S1/S2, RRR, no murmurs, rubs, gallops RESPIRATORY:  Clear to auscultation without rales, wheezing or rhonchi  ABDOMEN: Soft, non-tender, non-distended EXTREMITIES:  No edema; No deformity   ASSESSMENT AND PLAN: .    PAF, medication management Denies any tachycardia or palpitations. HR is well controlled today. She is not on any OAC. I discussed her stroke risk and risks of not being  on OAC. She verbalized understanding. Says she wants to wait and discuss this when her fiances/insurance is in order. I told her to contact our office when she is ready. She verbalized understanding. Continue Lopressor . Will obtain a BMET and CBC.   HTN SBP elevated today. Discussed to monitor BP at home at least 2 hours after medications and sitting for 5-10 minutes. Will bring her back in 4-6 weeks for a BP check. Continue Losartan  and Lopressor . Will provide refill per her request. Heart healthy diet and regular cardiovascular exercise encouraged.    Obesity Weight loss via diet and exercise encouraged. Discussed the impact being overweight would have on cardiovascular risk.      Dispo: Follow-up with me/APP in 6 months or sooner if anything changes.   Signed, Wendy Pointer, NP

## 2023-09-21 ENCOUNTER — Other Ambulatory Visit: Payer: Self-pay | Admitting: Cardiology

## 2023-09-22 ENCOUNTER — Other Ambulatory Visit: Payer: Self-pay | Admitting: Cardiology

## 2023-10-05 ENCOUNTER — Telehealth: Payer: Self-pay | Admitting: Orthopaedic Surgery

## 2023-10-05 MED ORDER — HYDROCODONE-ACETAMINOPHEN 7.5-325 MG PO TABS: 1.0000 | ORAL_TABLET | Freq: Four times a day (QID) | ORAL | 0 refills | Status: DC | PRN

## 2023-10-20 ENCOUNTER — Other Ambulatory Visit: Payer: Self-pay | Admitting: Cardiology

## 2023-10-20 ENCOUNTER — Other Ambulatory Visit: Payer: Self-pay | Admitting: *Deleted

## 2023-10-20 MED ORDER — METOPROLOL TARTRATE 100 MG PO TABS: 100.0000 mg | ORAL_TABLET | Freq: Two times a day (BID) | ORAL | 1 refills | Status: AC

## 2023-10-27 ENCOUNTER — Ambulatory Visit: Payer: Self-pay

## 2023-11-03 ENCOUNTER — Telehealth: Payer: Self-pay

## 2023-11-03 MED ORDER — HYDROCODONE-ACETAMINOPHEN 7.5-325 MG PO TABS: 1.0000 | ORAL_TABLET | Freq: Four times a day (QID) | ORAL | 0 refills | Status: DC | PRN

## 2023-11-03 NOTE — Telephone Encounter (Signed)
Hydrocodone-Acetaminophen 7.5/325 MG  Qty 40 Tablets ? ?PATIENT USES [facility] [address] ?

## 2023-11-08 ENCOUNTER — Other Ambulatory Visit: Payer: Self-pay

## 2023-11-08 ENCOUNTER — Emergency Department (HOSPITAL_COMMUNITY)
Admission: EM | Admit: 2023-11-08 | Discharge: 2023-11-08 | Disposition: A | Discharge status: Home / Self Care | Payer: Self-pay | Attending: Emergency Medicine | Admitting: Emergency Medicine

## 2023-11-08 ENCOUNTER — Encounter (HOSPITAL_COMMUNITY): Payer: Self-pay

## 2023-11-08 ENCOUNTER — Emergency Department (HOSPITAL_COMMUNITY)
Admission: EM | Admit: 2023-11-08 | Discharge: 2023-11-09 | Disposition: A | Discharge status: Home / Self Care | Payer: Self-pay | Attending: Emergency Medicine | Admitting: Emergency Medicine

## 2023-11-08 DIAGNOSIS — I10 Essential (primary) hypertension: Secondary | ICD-10-CM | POA: Insufficient documentation

## 2023-11-08 DIAGNOSIS — F17210 Nicotine dependence, cigarettes, uncomplicated: Secondary | ICD-10-CM | POA: Insufficient documentation

## 2023-11-08 LAB — CBC
HCT: 47.5 % — ABNORMAL HIGH (ref 36.0–46.0)
Hemoglobin: 15.9 g/dL — ABNORMAL HIGH (ref 12.0–15.0)
MCH: 32.9 pg (ref 26.0–34.0)
MCHC: 33.5 g/dL (ref 30.0–36.0)
MCV: 98.1 fL (ref 80.0–100.0)
Platelets: 204 10*3/uL (ref 150–400)
RBC: 4.84 MIL/uL (ref 3.87–5.11)
RDW: 12.6 % (ref 11.5–15.5)
WBC: 7.5 10*3/uL (ref 4.0–10.5)
nRBC: 0 % (ref 0.0–0.2)

## 2023-11-08 LAB — COMPREHENSIVE METABOLIC PANEL WITH GFR
ALT: 26 U/L (ref 0–44)
AST: 23 U/L (ref 15–41)
Albumin: 4 g/dL (ref 3.5–5.0)
Alkaline Phosphatase: 76 U/L (ref 38–126)
Anion gap: 11 (ref 5–15)
BUN: 16 mg/dL (ref 6–20)
CO2: 25 mmol/L (ref 22–32)
Calcium: 9 mg/dL (ref 8.9–10.3)
Chloride: 103 mmol/L (ref 98–111)
Creatinine, Ser: 0.44 mg/dL (ref 0.44–1.00)
GFR, Estimated: >60 mL/min (ref >60)
Glucose, Bld: 120 mg/dL — ABNORMAL HIGH (ref 70–99)
Potassium: 3.9 mmol/L (ref 3.5–5.1)
Sodium: 139 mmol/L (ref 135–145)
Total Bilirubin: <0.2 mg/dL (ref 0.0–1.2)
Total Protein: 6.6 g/dL (ref 6.5–8.1)

## 2023-11-08 MED ORDER — HYDRALAZINE HCL 25 MG PO TABS
25.0000 mg | ORAL_TABLET | Freq: Once | ORAL | Status: AC | PRN
  Administered 2023-11-08: 25 mg via ORAL
  Filled 2023-11-08: qty 1

## 2023-11-08 MED ORDER — HYDRALAZINE HCL 25 MG PO TABS: 25.0000 mg | ORAL_TABLET | Freq: Three times a day (TID) | ORAL | 0 refills | Status: DC | PRN

## 2023-11-08 MED ORDER — HYDRALAZINE HCL 25 MG PO TABS
25.0000 mg | ORAL_TABLET | Freq: Once | ORAL | Status: AC
  Administered 2023-11-08: 25 mg via ORAL
  Filled 2023-11-08: qty 1

## 2023-11-08 NOTE — ED Provider Notes (Signed)
 AP-EMERGENCY DEPT [facility] Emergency Department Provider Note MRN:  [id number]  Arrival date & time: 11/09/23     Chief Complaint   Hypertension   History of Present Illness   Wendy Ramirez is a 58 y.o. year-old female with a history of A-fib presenting to the ED with chief complaint of hypertension.  Did not sleep very much last night and was very busy today because of other staying.  Blood pressure was high again this evening, was 160 but then became anxious and then kept checking it and then it kept being higher, up to 200 at home.  Denies any symptoms.  Review of Systems  A thorough review of systems was obtained and all systems are negative except as noted in the HPI and PMH.   Patient's Health History    Past Medical History:  Diagnosis Date   Atrial fibrillation (HCC)    Back pain    Diverticulitis    Pneumonia    SVT (supraventricular tachycardia) (HCC)     Past Surgical History:  Procedure Laterality Date   CHOLECYSTECTOMY     TUBAL LIGATION      Family History  Problem Relation Age of Onset   Heart disease Father    Atrial fibrillation Maternal Grandmother    Heart attack Paternal Grandfather     Social History   Socioeconomic History   Marital status: Married    Spouse name: Not on file   Number of children: Not on file   Years of education: Not on file   Highest education level: Not on file  Occupational History   Not on file  Tobacco Use   Smoking status: Every Day    Current packs/day: 1.00    Average packs/day: 1 pack/day for 30.4 years (30.4 ttl pk-yrs)    Types: Cigarettes    Start date: 06/19/1993   Smokeless tobacco: Never  Vaping Use   Vaping status: Every Day   Start date: 10/17/2013  Substance and Sexual Activity   Alcohol use: No   Drug use: No   Sexual activity: Not on file  Other Topics Concern   Not on file  Social History Narrative   Not on file   Social Drivers of Health   Financial Resource Strain: Not on  file  Food Insecurity: Not on file  Transportation Needs: Not on file  Physical Activity: Not on file  Stress: Not on file  Social Connections: Not on file  Intimate Partner Violence: Not on file     Physical Exam   Vitals:   11/08/23 2320 11/08/23 2330  BP: (!) 201/91 (!) 181/90  Pulse: 68 60  Resp: 18 18  Temp:    SpO2: 96% 96%    CONSTITUTIONAL: Well-appearing, NAD NEURO/PSYCH:  Alert and oriented x 3, no focal deficits EYES:  eyes equal and reactive ENT/NECK:  no LAD, no JVD CARDIO: Regular rate, well-perfused, normal S1 and S2 PULM:  CTAB no wheezing or rhonchi GI/GU:  non-distended, non-tender MSK/SPINE:  No gross deformities, no edema SKIN:  no rash, atraumatic   *Additional and/or pertinent findings included in MDM below  Diagnostic and Interventional Summary    EKG Interpretation Date/Time:    Ventricular Rate:    PR Interval:    QRS Duration:    QT Interval:    QTC Calculation:   R Axis:      Text Interpretation:         Labs Reviewed - No data to display  No orders to  display    Medications  hydrALAZINE (APRESOLINE) tablet 25 mg (25 mg Oral Given 11/08/23 2328)     Procedures  /  Critical Care .1-3 Lead EKG Interpretation  Performed by: Edson Graces, MD Authorized by: Edson Graces, MD     Interpretation: normal     ECG rate:  60s   Rhythm: sinus rhythm     Ectopy: none     Conduction: normal   Comments:     Cardiac monitoring was ordered to monitor the patient for dysrhythmia.  I personally interpreted the patient's cardiac monitor while at the bedside.     ED Course and Medical Decision Making  Initial Impression and Ddx I evaluated the patient last night for similar complaints, had a reassuring workup.  Responded well to hydralazine here in the emergency department.  She did not feel this medication during the day, when she was having blood pressure issues this evening she tried to call the pharmacy or go get it filled but the  pharmacy was already closed.  She is mostly here for a dose of that medicine to see if it can help her blood pressure acutely until she can follow-up with her primary care doctor.  With no symptoms I doubt emergent process at this time, no real indication for further diagnostics.  Past medical/surgical history that increases complexity of ED encounter: Hypertension  Interpretation of Diagnostics Laboratory and/or imaging options to aid in the diagnosis/care of the patient were considered.  After careful history and physical examination, it was determined that there was no indication for diagnostics at this time.  Patient Reassessment and Ultimate Disposition/Management     Responding well to hydralazine, appropriate for discharge.  Patient management required discussion with the following services or consulting groups:  None  Complexity of Problems Addressed Acute complicated illness or Injury  Additional Data Reviewed and Analyzed Further history obtained from: Further history from spouse/family member  Additional Factors Impacting ED Encounter Risk Prescriptions  Merrick Abe. Harless Lien, MD [facility] Emergency Medicine [facility] [email or web address]  Final Clinical Impressions(s) / ED Diagnoses     ICD-10-CM   1. Hypertension, unspecified type  I10       ED Discharge Orders          Ordered    hydrALAZINE (APRESOLINE) 25 MG tablet  3 times daily PRN        11/09/23 0021             Discharge Instructions Discussed with and Provided to Patient:     Discharge Instructions      You were evaluated in the Emergency Department and after careful evaluation, we did not find any emergent condition requiring admission or further testing in the hospital.  Your exam/testing today is overall reassuring.  Fill the prescription for hydralazine to use as needed, follow-up closely with your primary care doctor to discuss your blood pressure management.  Please  return to the Emergency Department if you experience any worsening of your condition.   Thank you for allowing us  to be a part of your care.       Edson Graces, MD 11/09/23 [phone]

## 2023-11-08 NOTE — ED Notes (Signed)
 ED Provider at bedside.

## 2023-11-08 NOTE — Discharge Instructions (Signed)
 You were evaluated in the Emergency Department and after careful evaluation, we did not find any emergent condition requiring admission or further testing in the hospital.  Your exam/testing today is overall reassuring.  Recommend follow-up with your regular doctor to discuss your blood pressure management.  Can use the hydralazine medication as needed until then as we discussed.  Please return to the Emergency Department if you experience any worsening of your condition.   Thank you for allowing us  to be a part of your care.

## 2023-11-08 NOTE — ED Triage Notes (Signed)
 Pt here from home with c/o HTN, pt says 1st time she checked her BP today, 160's over 80's, then as the day continued pt says BP kept going up, pt has checked over 10 times in the last couple of hours, pt says we gave her a Rx for BP meds but the pharmacy was out of that medication.

## 2023-11-08 NOTE — ED Provider Notes (Signed)
 AP-EMERGENCY DEPT [facility] Emergency Department Provider Note MRN:  [id number]  Arrival date & time: 11/08/23     Chief Complaint   Hypertension   History of Present Illness   Wendy Ramirez is a 58 y.o. year-old female with a history of diverticulitis, A-fib presenting to the ED with chief complaint of hypertension.  Felt funny upon awakening and noted her blood pressure to be high up to 235 systolic.  Taking her medications as directed.  General feeling of unwellness or anxiety, felt her blood pressure was high by pressure to her face.  Denies headache, no vision change, no chest pain, no other complaints.  Review of Systems  A thorough review of systems was obtained and all systems are negative except as noted in the HPI and PMH.   Patient's Health History    Past Medical History:  Diagnosis Date   Atrial fibrillation (HCC)    Back pain    Diverticulitis    Pneumonia    SVT (supraventricular tachycardia) (HCC)     Past Surgical History:  Procedure Laterality Date   CHOLECYSTECTOMY     TUBAL LIGATION      Family History  Problem Relation Age of Onset   Heart disease Father    Atrial fibrillation Maternal Grandmother    Heart attack Paternal Grandfather     Social History   Socioeconomic History   Marital status: Married    Spouse name: Not on file   Number of children: Not on file   Years of education: Not on file   Highest education level: Not on file  Occupational History   Not on file  Tobacco Use   Smoking status: Every Day    Current packs/day: 1.00    Average packs/day: 1 pack/day for 30.4 years (30.4 ttl pk-yrs)    Types: Cigarettes    Start date: 06/19/1993   Smokeless tobacco: Never  Vaping Use   Vaping status: Every Day   Start date: 10/17/2013  Substance and Sexual Activity   Alcohol use: No   Drug use: No   Sexual activity: Not on file  Other Topics Concern   Not on file  Social History Narrative   Not on file   Social Drivers  of Health   Financial Resource Strain: Not on file  Food Insecurity: Not on file  Transportation Needs: Not on file  Physical Activity: Not on file  Stress: Not on file  Social Connections: Not on file  Intimate Partner Violence: Not on file     Physical Exam   Vitals:   11/08/23 0506 11/08/23 0530  BP: (!) 190/105 128/74  Pulse:  64  Resp:  17  Temp:    SpO2:  94%    CONSTITUTIONAL: Well-appearing, NAD NEURO/PSYCH:  Alert and oriented x 3, normal and symmetric strength and sensation, normal coordination, normal speech EYES:  eyes equal and reactive ENT/NECK:  no LAD, no JVD CARDIO: Regular rate, well-perfused, normal S1 and S2 PULM:  CTAB no wheezing or rhonchi GI/GU:  non-distended, non-tender MSK/SPINE:  No gross deformities, no edema SKIN:  no rash, atraumatic   *Additional and/or pertinent findings included in MDM below  Diagnostic and Interventional Summary    EKG Interpretation Date/Time:  Sunday November 08 2023 05:06:01 EDT Ventricular Rate:  67 PR Interval:  189 QRS Duration:  95 QT Interval:  425 QTC Calculation: 449 R Axis:   -26  Text Interpretation: Sinus rhythm Borderline left axis deviation Low voltage, precordial leads Confirmed by  Gwenetta Lennert [phone]) on 11/08/2023 5:41:17 AM       Labs Reviewed  CBC - Abnormal; Notable for the following components:      Result Value   Hemoglobin 15.9 (*)    HCT 47.5 (*)    All other components within normal limits  COMPREHENSIVE METABOLIC PANEL WITH GFR - Abnormal; Notable for the following components:   Glucose, Bld 120 (*)    All other components within normal limits    No orders to display    Medications  hydrALAZINE (APRESOLINE) tablet 25 mg (25 mg Oral Given 11/08/23 0506)     Procedures  /  Critical Care Procedures  ED Course and Medical Decision Making  Initial Impression and Ddx Vague symptoms in the setting of hypertension with an otherwise well-appearing patient in no acute distress,  overall doubt hypertensive emergency, will check screening labs for renal function, reassess after hydralazine.  Past medical/surgical history that increases complexity of ED encounter: None  Interpretation of Diagnostics I personally reviewed the EKG and my interpretation is as follows: Sinus rhythm without concerning ischemic findings  No significant blood count or electrolyte disturbance.  Patient Reassessment and Ultimate Disposition/Management     Blood pressure normalized, patient appropriate for discharge.  Patient management required discussion with the following services or consulting groups:  None  Complexity of Problems Addressed Acute illness or injury that poses threat of life of bodily function  Additional Data Reviewed and Analyzed Further history obtained from: Further history from spouse/family member  Additional Factors Impacting ED Encounter Risk Consideration of hospitalization  Merrick Abe. Harless Lien, MD [facility] Emergency Medicine [facility] [email or web address]  Final Clinical Impressions(s) / ED Diagnoses     ICD-10-CM   1. Hypertension, unspecified type  I10       ED Discharge Orders          Ordered    hydrALAZINE (APRESOLINE) 25 MG tablet  3 times daily PRN        11/08/23 0554             Discharge Instructions Discussed with and Provided to Patient:    Discharge Instructions      You were evaluated in the Emergency Department and after careful evaluation, we did not find any emergent condition requiring admission or further testing in the hospital.  Your exam/testing today is overall reassuring.  Recommend follow-up with your regular doctor to discuss your blood pressure management.  Can use the hydralazine medication as needed until then as we discussed.  Please return to the Emergency Department if you experience any worsening of your condition.   Thank you for allowing us  to be a part of your care.      Edson Graces, MD 11/08/23 [phone]

## 2023-11-08 NOTE — ED Triage Notes (Signed)
 Pt states she felt funny this morning so she took her BP and it read 235/120. Reports she has been compliant with BP medication.

## 2023-11-09 MED ORDER — HYDRALAZINE HCL 25 MG PO TABS: 25.0000 mg | ORAL_TABLET | Freq: Three times a day (TID) | ORAL | 0 refills | Status: DC | PRN

## 2023-11-09 NOTE — Discharge Instructions (Signed)
 You were evaluated in the Emergency Department and after careful evaluation, we did not find any emergent condition requiring admission or further testing in the hospital.  Your exam/testing today is overall reassuring.  Fill the prescription for hydralazine to use as needed, follow-up closely with your primary care doctor to discuss your blood pressure management.  Please return to the Emergency Department if you experience any worsening of your condition.   Thank you for allowing us  to be a part of your care.

## 2023-12-02 ENCOUNTER — Encounter: Payer: Self-pay | Admitting: Orthopaedic Surgery

## 2023-12-02 ENCOUNTER — Ambulatory Visit (INDEPENDENT_AMBULATORY_CARE_PROVIDER_SITE_OTHER): Payer: Self-pay | Admitting: Orthopaedic Surgery

## 2023-12-02 VITALS — BP 177/93 | HR 73 | Ht 67.0 in | Wt 249.0 lb

## 2023-12-02 DIAGNOSIS — G8929 Other chronic pain: Secondary | ICD-10-CM

## 2023-12-02 DIAGNOSIS — M545 Low back pain, unspecified: Secondary | ICD-10-CM

## 2023-12-02 NOTE — Progress Notes (Signed)
 I am the same.  She has chronic lower back pain.  She has no recent trauma, no weakness. She is active and doing her exercises. She has good and bad days but more good days with the warm weather.  Lower back has good ROM, muscle tone and strength are normal, NV intact, SLR negative, gait normal.  Encounter Diagnosis  Name Primary?   Chronic midline low back pain without sciatica Yes   Return in three months.  Continue her exercises.  Call if any problem.  Precautions discussed.  Electronically Signed Lemond Stable, MD 7/9/20252:12 PM

## 2023-12-07 ENCOUNTER — Telehealth: Payer: Self-pay | Admitting: Orthopaedic Surgery

## 2023-12-07 NOTE — Telephone Encounter (Signed)
 Dr. Janae pt - pt lvm requesting a refill for Hydrocodone  7.5-325 to be sent to [facility]

## 2023-12-08 MED ORDER — HYDROCODONE-ACETAMINOPHEN 7.5-325 MG PO TABS: 1.0000 | ORAL_TABLET | Freq: Four times a day (QID) | ORAL | 0 refills | Status: DC | PRN

## 2023-12-08 NOTE — Addendum Note (Signed)
 Addended by: MARCINE HUSBAND T on: 12/08/2023 01:30 PM   Modules accepted: Orders

## 2023-12-08 NOTE — Addendum Note (Signed)
 Addended by: BRENNA NORLEEN ORN on: 12/08/2023 10:10 PM   Modules accepted: Orders

## 2023-12-08 NOTE — Telephone Encounter (Signed)
 Dr. Janae pt - pt lvm stating that Dr. MARLA sent her spouse's medication in, but not hers.

## 2024-01-07 ENCOUNTER — Telehealth: Payer: Self-pay | Admitting: Orthopaedic Surgery

## 2024-01-07 NOTE — Telephone Encounter (Signed)
 DR. BRENNA   PATIENT REQUEST REFILL ON PAIN MEDICINE    HYDROcodone-acetaminophen (NORCO) 7.5-325 MG tablet     PHARMACY:  [facility] [address]

## 2024-01-09 MED ORDER — HYDROCODONE-ACETAMINOPHEN 7.5-325 MG PO TABS: 1.0000 | ORAL_TABLET | Freq: Four times a day (QID) | ORAL | 0 refills | Status: DC | PRN

## 2024-01-15 ENCOUNTER — Encounter: Payer: Self-pay | Admitting: Radiology

## 2024-01-17 ENCOUNTER — Encounter (HOSPITAL_COMMUNITY): Payer: Self-pay

## 2024-01-17 ENCOUNTER — Emergency Department (HOSPITAL_COMMUNITY)
Admission: EM | Admit: 2024-01-17 | Discharge: 2024-01-17 | Disposition: A | Discharge status: Home / Self Care | Payer: Self-pay | Attending: Emergency Medicine | Admitting: Emergency Medicine

## 2024-01-17 ENCOUNTER — Other Ambulatory Visit: Payer: Self-pay

## 2024-01-17 DIAGNOSIS — Z79899 Other long term (current) drug therapy: Secondary | ICD-10-CM | POA: Insufficient documentation

## 2024-01-17 DIAGNOSIS — R030 Elevated blood-pressure reading, without diagnosis of hypertension: Secondary | ICD-10-CM

## 2024-01-17 DIAGNOSIS — I10 Essential (primary) hypertension: Secondary | ICD-10-CM | POA: Insufficient documentation

## 2024-01-17 HISTORY — DX: Essential (primary) hypertension: I10

## 2024-01-17 LAB — CBC
HCT: 48.3 % — ABNORMAL HIGH (ref 36.0–46.0)
Hemoglobin: 15.8 g/dL — ABNORMAL HIGH (ref 12.0–15.0)
MCH: 31.7 pg (ref 26.0–34.0)
MCHC: 32.7 g/dL (ref 30.0–36.0)
MCV: 96.8 fL (ref 80.0–100.0)
Platelets: 213 K/uL (ref 150–400)
RBC: 4.99 MIL/uL (ref 3.87–5.11)
RDW: 12.4 % (ref 11.5–15.5)
WBC: 5.9 K/uL (ref 4.0–10.5)
nRBC: 0 % (ref 0.0–0.2)

## 2024-01-17 LAB — BASIC METABOLIC PANEL WITH GFR
Anion gap: 9 (ref 5–15)
BUN: 18 mg/dL (ref 6–20)
CO2: 27 mmol/L (ref 22–32)
Calcium: 9.4 mg/dL (ref 8.9–10.3)
Chloride: 104 mmol/L (ref 98–111)
Creatinine, Ser: 0.58 mg/dL (ref 0.44–1.00)
GFR, Estimated: >60 mL/min (ref >60)
Glucose, Bld: 113 mg/dL — ABNORMAL HIGH (ref 70–99)
Potassium: 4.6 mmol/L (ref 3.5–5.1)
Sodium: 140 mmol/L (ref 135–145)

## 2024-01-17 MED ORDER — HYDRALAZINE HCL 25 MG PO TABS: 25.0000 mg | ORAL_TABLET | Freq: Three times a day (TID) | ORAL | 0 refills | Status: DC | PRN

## 2024-01-17 NOTE — Discharge Instructions (Addendum)
 Continue checking BP once daily. If BP is elevated >185 systolic please take Hydralazine . Recheck BP in 1-2 hours.  Return to ER with new or worsening symptoms. Follow up with Dr Alvan.

## 2024-01-17 NOTE — ED Triage Notes (Signed)
 Pt arrived POV with c/o bp 240/120 with her being compliant with bp meds.

## 2024-01-17 NOTE — ED Provider Notes (Signed)
 [place] EMERGENCY DEPARTMENT AT [facility] Provider Note   CSN: [id number] Arrival date & time: 01/17/24  1318     Patient presents with: No chief complaint on file.   Wendy Ramirez is a 58 y.o. female PAF no OAC, HTN, obesity presenting with complaint of hypertension.  Patient reports that she monitors her blood pressure for cardiologist once daily.  She reports that yesterday her blood pressure was elevated. She reports that she felt very anxious about this, couldn't get to sleep and then kept checking BP. BP was elevated again this morning at 240 systolic at home.  She was given hydralazine  to take as needed for blood pressures over systolic 185.  She took the hydralazine  and her blood pressure did came down. She has been compliant with her blood pressure medication including Lopressor  and losartan . She is denying current headache, chest pain, shortness of breath, abdominal pain.    HPI     Prior to Admission medications   Medication Sig Start Date End Date Taking? Authorizing Provider  celecoxib  (CELEBREX ) 200 MG capsule Take 1 capsule (200 mg total) by mouth 2 (two) times daily. 06/10/22   Brenna Lin, MD  hydrALAZINE  (APRESOLINE ) 25 MG tablet Take 1 tablet (25 mg total) by mouth 3 (three) times daily as needed (for systolic blood pressure >185). 11/08/23   Theadore Ozell HERO, MD  hydrALAZINE  (APRESOLINE ) 25 MG tablet Take 1 tablet (25 mg total) by mouth 3 (three) times daily as needed (for systolic BP >185). 11/09/23   Theadore Ozell HERO, MD  HYDROcodone -acetaminophen  (NORCO) 7.5-325 MG tablet Take 1 tablet by mouth every 6 (six) hours as needed for moderate pain (pain score 4-6) (Must last 30 days). 01/09/24   Brenna Lin, MD  losartan  (COZAAR ) 50 MG tablet TAKE 1 TABLET(50 MG) BY MOUTH DAILY 09/22/23   Alvan Dorn FALCON, MD  metoprolol  tartrate (LOPRESSOR ) 100 MG tablet TAKE 1 TABLET(100 MG) BY MOUTH TWICE DAILY 10/20/23   Alvan Dorn FALCON, MD  metoprolol  tartrate  (LOPRESSOR ) 100 MG tablet Take 1 tablet (100 mg total) by mouth 2 (two) times daily. 10/20/23   Alvan Dorn FALCON, MD    Allergies: Patient has no known allergies.    Review of Systems  Updated Vital Signs LMP 05/23/2013   Physical Exam Vitals and nursing note reviewed.  Constitutional:      General: She is not in acute distress.    Appearance: She is not toxic-appearing.  HENT:     Head: Normocephalic and atraumatic.  Eyes:     General: No scleral icterus.    Conjunctiva/sclera: Conjunctivae normal.  Cardiovascular:     Rate and Rhythm: Normal rate and regular rhythm.     Pulses: Normal pulses.     Heart sounds: Normal heart sounds.  Pulmonary:     Effort: Pulmonary effort is normal. No respiratory distress.     Breath sounds: Normal breath sounds.  Abdominal:     General: Abdomen is flat. Bowel sounds are normal.     Palpations: Abdomen is soft.     Tenderness: There is no abdominal tenderness. There is no right CVA tenderness or left CVA tenderness.  Musculoskeletal:     Right lower leg: No edema.     Left lower leg: No edema.  Skin:    General: Skin is warm and dry.     Findings: No lesion.  Neurological:     General: No focal deficit present.     Mental Status: She is alert  and oriented to person, place, and time. Mental status is at baseline.     Comments: Moving extremities without difficulty, well appearing.      (all labs ordered are listed, but only abnormal results are displayed) Labs Reviewed  BASIC METABOLIC PANEL WITH GFR - Abnormal; Notable for the following components:      Result Value   Glucose, Bld 113 (*)    All other components within normal limits  CBC - Abnormal; Notable for the following components:   Hemoglobin 15.8 (*)    HCT 48.3 (*)    All other components within normal limits    EKG: EKG Interpretation Date/Time:  Sunday January 17 2024 13:51:57 EDT Ventricular Rate:  67 PR Interval:  194 QRS Duration:  95 QT  Interval:  416 QTC Calculation: 440 R Axis:   -14  Text Interpretation: Sinus rhythm Low voltage, precordial leads No significant change since last tracing Confirmed by Dean Clarity [phone]) on 01/17/2024 2:02:11 PM  Radiology: No results found.   Procedures   Medications Ordered in the ED - No data to display                                  Medical Decision Making Amount and/or Complexity of Data Reviewed Labs: ordered.  Risk Prescription drug management.   This patient presents to the ED for concern of HTN, this involves an extensive number of treatment options, and is a complaint that carries with it a high risk of complications and morbidity.  The differential diagnosis includes HTN emergency, HTN urgency, elevated BP, ACS, CHF   Co morbidities that complicate the patient evaluation  HTN   Additional history obtained:  Additional history obtained from Cardiology OV 09/18/23 - on Losartan , Lopressor , Hydralazine  PRN   Lab Tests:  I personally interpreted labs.  The pertinent results include:   CBC, BMP to evaluate for end organ damage.    Cardiac Monitoring: / EKG:  The patient was maintained on a cardiac monitor.  I personally viewed and interpreted the cardiac monitored which showed an underlying rhythm of: normal sinus, no ischemia    Problem List / ED Course / Critical interventions / Medication management  Patient presents emergency room with complaint of asymptomatic hypertension. No sign of end organ damage on  labs. She had non focal neurologic exam. No chest pain, EKG without ST changes.  She took hydralazine  and lowered patient's blood pressure appropriately. She currently have BP of 144/88 in room. She was able to bring her BP cuff with her today and readings were consistent here. I have sent refill of Hydralazine . Will place follow up referral to cardiology as they are managing patients BP. Feel appropriate for discharge with outpatient follow up.         Final diagnoses:  Elevated blood pressure reading  Primary hypertension    ED Discharge Orders          Ordered    Ambulatory referral to Cardiology       Comments: If you have not heard from the Cardiology office within the next 72 hours please call [phone].   01/17/24 1348               Benjamim Harnish, Warren SAILOR, PA-C 01/17/24 1425    Dean Clarity, MD 01/17/24 1426

## 2024-02-08 ENCOUNTER — Telehealth: Payer: Self-pay | Admitting: Orthopaedic Surgery

## 2024-02-08 NOTE — Telephone Encounter (Signed)
 Dr. Janae pt - pt is requesting a refill for Hydrocodone  7.5-325 to be sent to [facility] on [address]

## 2024-02-09 MED ORDER — HYDROCODONE-ACETAMINOPHEN 7.5-325 MG PO TABS: 1.0000 | ORAL_TABLET | Freq: Four times a day (QID) | ORAL | 0 refills | Status: DC | PRN

## 2024-02-23 ENCOUNTER — Telehealth: Payer: Self-pay | Admitting: Orthopaedic Surgery

## 2024-02-23 MED ORDER — HYDROCODONE-ACETAMINOPHEN 7.5-325 MG PO TABS: 1.0000 | ORAL_TABLET | Freq: Four times a day (QID) | ORAL | 0 refills | Status: DC | PRN

## 2024-02-23 NOTE — Telephone Encounter (Signed)
 DR. BRENNA    Patient came in request refill on pain medicine   HYDROcodone -acetaminophen  (NORCO) 7.5-325 MG tablet    Pharmacy:  [facility] on [address]

## 2024-03-02 ENCOUNTER — Ambulatory Visit: Payer: Self-pay | Admitting: Orthopaedic Surgery

## 2024-03-14 ENCOUNTER — Other Ambulatory Visit: Payer: Self-pay | Admitting: Cardiology

## 2024-03-15 ENCOUNTER — Telehealth: Payer: Self-pay | Admitting: Cardiology

## 2024-03-15 NOTE — Telephone Encounter (Signed)
*  STAT* If patient is at the pharmacy, call can be transferred to refill team.   1. Which medications need to be refilled? (please list name of each medication and dose if known)   losartan  (COZAAR ) 50 MG tablet     2. Would you like to learn more about the convenience, safety, & potential cost savings by using the [facility]? no   3. Are you open to using the [facility] (Type [facility].  ). no   4. Which pharmacy/location (including street and city if local pharmacy) is medication to be sent to? [id number] - [place], [place] - [address]     5. Do they need a 30 day or 90 day supply? 90 day    Pt is currently at the pharmacy and out of medication

## 2024-03-15 NOTE — Telephone Encounter (Signed)
Duplicate - rx sent to pharmacy

## 2024-03-21 ENCOUNTER — Ambulatory Visit: Payer: Self-pay | Admitting: Nurse Practitioner

## 2024-03-21 NOTE — Progress Notes (Deleted)
  Cardiology Office Note:  .   Date:  09/18/2023 ID:  Wendy Ramirez, DOB [birth year], MRN [id number] PCP: Freddrick Johns  [facility] Providers Cardiologist:  Alvan Carrier, MD    History of Present Illness: .   Wendy Ramirez is a 58 y.o. female with a PMH of PAF, hypertension, and past history of hair loss, who presents today for overdue follow-up appointment.  Last seen by Dr. Carrier Alvan on October 30, 2022.  Eliquis  was started for her in the past due to chads Vascor of 2.  However, Eliquis  was too expensive, was referred to Coumadin  clinic.  She was in normal sinus rhythm on exam that day.  Losartan  was increased to 50 mg daily due to elevated BP.  Today she presents for follow-up.  She states she is overall doing well. Denies any chest pain, shortness of breath, palpitations, syncope, presyncope, dizziness, orthopnea, PND, swelling or significant weight changes, acute bleeding, or claudication.    ROS: Negative. See HPI.  SH: Takes care of 16 year old mother. Has grandchildren ranging in age from 23 -54 years old. Enjoys supporting grandchildren at cheerleading and football games.   Studies Reviewed: SABRA    EKG: EKG is not ordered today.   Physical Exam:   VS:  LMP 05/23/2013    Wt Readings from Last 3 Encounters:  01/17/24 235 lb (106.6 kg)  12/02/23 249 lb (112.9 kg)  11/08/23 245 lb (111.1 kg)    GEN: Obese, 58 y.o. female in no acute distress NECK: No JVD; No carotid bruits CARDIAC: S1/S2, RRR, no murmurs, rubs, gallops RESPIRATORY:  Clear to auscultation without rales, wheezing or rhonchi  ABDOMEN: Soft, non-tender, non-distended EXTREMITIES:  No edema; No deformity   ASSESSMENT AND PLAN: .    PAF, medication management Denies any tachycardia or palpitations. HR is well controlled today. She is not on any OAC. I discussed her stroke risk and risks of not being on OAC. She verbalized understanding. Says she wants to wait and discuss this when her fiances/insurance  is in order. I told her to contact our office when she is ready. She verbalized understanding. Continue Lopressor . Will obtain a BMET and CBC.   HTN SBP elevated today. Discussed to monitor BP at home at least 2 hours after medications and sitting for 5-10 minutes. Will bring her back in 4-6 weeks for a BP check. Continue Losartan  and Lopressor . Will provide refill per her request. Heart healthy diet and regular cardiovascular exercise encouraged.    Obesity Weight loss via diet and exercise encouraged. Discussed the impact being overweight would have on cardiovascular risk.      Dispo: Follow-up with me/APP in 6 months or sooner if anything changes.   Signed, Almarie Crate, NP

## 2024-03-28 ENCOUNTER — Encounter: Payer: Self-pay | Admitting: Radiology

## 2024-04-20 ENCOUNTER — Emergency Department (HOSPITAL_COMMUNITY)
Admission: EM | Admit: 2024-04-20 | Discharge: 2024-04-20 | Disposition: A | Discharge status: Home / Self Care | Payer: Self-pay | Attending: Emergency Medicine | Admitting: Emergency Medicine

## 2024-04-20 ENCOUNTER — Other Ambulatory Visit: Payer: Self-pay

## 2024-04-20 ENCOUNTER — Encounter (HOSPITAL_COMMUNITY): Payer: Self-pay

## 2024-04-20 DIAGNOSIS — F419 Anxiety disorder, unspecified: Secondary | ICD-10-CM | POA: Insufficient documentation

## 2024-04-20 DIAGNOSIS — I4891 Unspecified atrial fibrillation: Secondary | ICD-10-CM | POA: Insufficient documentation

## 2024-04-20 DIAGNOSIS — R21 Rash and other nonspecific skin eruption: Secondary | ICD-10-CM | POA: Insufficient documentation

## 2024-04-20 DIAGNOSIS — Z79899 Other long term (current) drug therapy: Secondary | ICD-10-CM | POA: Insufficient documentation

## 2024-04-20 DIAGNOSIS — F17210 Nicotine dependence, cigarettes, uncomplicated: Secondary | ICD-10-CM | POA: Insufficient documentation

## 2024-04-20 DIAGNOSIS — I10 Essential (primary) hypertension: Secondary | ICD-10-CM | POA: Insufficient documentation

## 2024-04-20 MED ORDER — CLONIDINE HCL 0.1 MG PO TABS
0.1000 mg | ORAL_TABLET | Freq: Once | ORAL | Status: AC
  Administered 2024-04-20: 0.1 mg via ORAL
  Filled 2024-04-20: qty 1

## 2024-04-20 NOTE — ED Provider Notes (Addendum)
 Emergency Department Provider Note  TRIAGE NOTE: Pt states that she believes she is hypertensive. She reports being too anxious to check it at home. Pt states she developed a rash on chest yesterday that went away and then came back. On BP medications and taking as prescribed.  HISTORY  Chief Complaint No chief complaint on file.   HPI Wendy Ramirez is a 58 y.o. female with  concerns of elevated blood pressure and a rash. She reports a history of hypertension and recent changes in her pain management regimen, including the initiation of Suboxone approximately 10-11 days ago. Since starting Suboxone, she has developed a rash localized to her chest and experienced episodes of significantly elevated blood pressure, which she describes as sky high. The rash does not burn or itch and was more pronounced a few hours prior to presentation. She notes associated symptoms of facial flushing and feeling hot, which exacerbate her anxiety about her blood pressure. She denies any prior similar episodes when her blood pressure was elevated. Her current medications include metoprolol , losartan , and clonidine , which she takes only at night due to fear of overmedication. She has not yet seen her cardiologist for follow-up. The patient also reports significant stress related to caring for her elderly mother and financial concerns due to lack of insurance. She denies any recent changes in over-the-counter medications. History was obtained from the patient.  PMH Past Medical History:  Diagnosis Date   Atrial fibrillation (HCC)    Back pain    Diverticulitis    Hypertension    Pneumonia    SVT (supraventricular tachycardia)     Home Medications Prior to Admission medications   Medication Sig Start Date End Date Taking? Authorizing Provider  hydrALAZINE  (APRESOLINE ) 25 MG tablet Take 1 tablet (25 mg total) by mouth 3 (three) times daily as needed (for systolic blood pressure >185). 01/17/24   Barrett,  Jamie N, PA-C  HYDROcodone -acetaminophen  (NORCO) 7.5-325 MG tablet Take 1 tablet by mouth every 6 (six) hours as needed for moderate pain (pain score 4-6) (Must last 30 days). 02/23/24   Brenna Lin, MD  losartan  (COZAAR ) 50 MG tablet TAKE 1 TABLET(50 MG) BY MOUTH DAILY 03/15/24   Alvan Dorn FALCON, MD  metoprolol  tartrate (LOPRESSOR ) 100 MG tablet Take 1 tablet (100 mg total) by mouth 2 (two) times daily. 10/20/23   Alvan Dorn FALCON, MD    Social History Social History   Tobacco Use   Smoking status: Every Day    Current packs/day: 1.00    Average packs/day: 1 pack/day for 30.8 years (30.8 ttl pk-yrs)    Types: Cigarettes    Start date: 06/19/1993   Smokeless tobacco: Never  Vaping Use   Vaping status: Every Day   Start date: 10/17/2013  Substance Use Topics   Alcohol use: No   Drug use: No    Review of Systems: Documented in HPI ____________________________________________  PHYSICAL EXAM: VITAL SIGNS: Triage: Blood pressure (!) 199/83, pulse 77, resp. rate 18, last menstrual period 05/23/2013, SpO2 97%.  Vitals:   04/20/24 0400 04/20/24 0445 04/20/24 0500 04/20/24 0530  BP: (!) 199/83 (!) 164/68 (!) 146/64 (!) 125/56  Pulse: 77 73 72 71  Resp:      Temp:   97.8 F (36.6 C)   TempSrc:   Oral   SpO2: 97% 95% 95% 95%    Physical Exam Vitals and nursing note reviewed.  Constitutional:      Appearance: She is well-developed.  HENT:  Head: Normocephalic and atraumatic.     Mouth/Throat:     Mouth: Mucous membranes are moist.  Eyes:     Extraocular Movements: Extraocular movements intact.     Pupils: Pupils are equal, round, and reactive to light.  Cardiovascular:     Rate and Rhythm: Normal rate and regular rhythm.  Pulmonary:     Effort: No respiratory distress.     Breath sounds: No stridor.  Abdominal:     General: There is no distension.  Musculoskeletal:     Cervical back: Normal range of motion.     Right lower leg: No edema.     Left lower leg: No  edema.  Neurological:     Mental Status: She is alert.     Comments: No altered mental status, able to give full seemingly accurate history.  Face is symmetric, EOM's intact, pupils equal and reactive, vision intact, tongue and uvula midline without deviation. Upper and Lower extremity motor 5/5, intact pain perception in distal extremities, 2+ reflexes in biceps, patella and achilles tendons. Able to perform finger to nose normal with both hands. Walks without assistance or evident ataxia.         ____________________________________________   LABS (all labs ordered are listed, but only abnormal results are displayed)  Labs Reviewed - No data to display ____________________________________________  EKG   EKG Interpretation Date/Time:    Ventricular Rate:    PR Interval:    QRS Duration:    QT Interval:    QTC Calculation:   R Axis:      Text Interpretation:          ____________________________________________  RADIOLOGY  No results found. ____________________________________________  PROCEDURES  Procedure(s) performed:   Procedures ____________________________________________  INITIAL IMPRESSION / ASSESSMENT AND PLAN   Clinical Course as of 04/20/24 0652  Wed Apr 20, 2024  0449 Initial Evaluation:  Patient presents with elevated blood pressure and a chest rash that emerged after starting Suboxone approximately ten days ago. Plan:  Administer a dose of clonidine  to manage blood pressure and anxiety. Advise patient to take clonidine  during the day and monitor its effects. Encourage cognitive behavioral therapy for anxiety management. Suggest discussing anxiety medication options with the Suboxone clinic. Reassure patient about blood pressure readings and educate on when to seek emergency care. Advise follow-up with cardiologist and consider finding a primary care provider for ongoing management. [JM]    Clinical Course User Index [JM] Taygan Connell, Selinda,  MD     Images ordered viewed and obtained by myself. Agree with Radiology interpretation. Details in ED course.  Labs ordered reviewed by myself as detailed in ED course.  Consultations obtained/considered detailed in ED course.    CRITICAL INTERVENTIONS: N/A  Patient blood pressure improved significantly.  No symptoms to suggest need for workup in the emergency room.  Long discussion with her regarding symptoms that require immediate evaluation such as vision changes, neurologic changes, severe pain in her head, chest, back, abdomen or extremities, dyspnea, lower extremity swelling, decreased urine output.  Patient appreciative of the care will continue working on stress reduction and outpatient management.  She will still take her clonidine  regularly.  Will follow-up with her cardiologist.     FINAL IMPRESSION Final diagnoses:  Asymptomatic hypertension     Disposition A medical screening exam was performed and I feel the patient has had an appropriate workup for their chief complaint at this time and likelihood of emergent condition existing is low. They have been counseled on  decision, DISCHARGE, follow up and which symptoms necessitate immediate return to the emergency department. They or their family verbally stated understanding and agreement with plan and discharged in stable condition.   ____________________________________________   NEW OUTPATIENT MEDICATIONS STARTED DURING THIS VISIT:  Discharge Medication List as of 04/20/2024  5:46 AM      Note:  This note was prepared with assistance of Dragon voice recognition software. Occasional wrong-word or sound-a-like substitutions may have occurred due to the inherent limitations of voice recognition software.    Loyce Flaming, Selinda, MD 04/20/24 9346    Lorette Selinda, MD 04/20/24 [phone]

## 2024-04-20 NOTE — ED Triage Notes (Signed)
 Pt states that she believes she is hypertensive. She reports being too anxious to check it at home. Pt states she developed a rash on chest yesterday that went away and then came back. On BP medications and taking as prescribed.

## 2024-05-03 ENCOUNTER — Other Ambulatory Visit: Payer: Self-pay

## 2024-05-03 ENCOUNTER — Emergency Department (HOSPITAL_COMMUNITY)
Admission: EM | Admit: 2024-05-03 | Discharge: 2024-05-03 | Disposition: A | Discharge status: Home / Self Care | Payer: Self-pay | Attending: Emergency Medicine | Admitting: Emergency Medicine

## 2024-05-03 ENCOUNTER — Encounter (HOSPITAL_COMMUNITY): Payer: Self-pay

## 2024-05-03 DIAGNOSIS — I10 Essential (primary) hypertension: Secondary | ICD-10-CM

## 2024-05-03 LAB — CBC
HCT: 48.1 % — ABNORMAL HIGH (ref 36.0–46.0)
Hemoglobin: 15.6 g/dL — ABNORMAL HIGH (ref 12.0–15.0)
MCH: 31.5 pg (ref 26.0–34.0)
MCHC: 32.4 g/dL (ref 30.0–36.0)
MCV: 97 fL (ref 80.0–100.0)
Platelets: 231 K/uL (ref 150–400)
RBC: 4.96 MIL/uL (ref 3.87–5.11)
RDW: 12 % (ref 11.5–15.5)
WBC: 7.5 K/uL (ref 4.0–10.5)
nRBC: 0 % (ref 0.0–0.2)

## 2024-05-03 LAB — COMPREHENSIVE METABOLIC PANEL WITH GFR
ALT: 30 U/L (ref 0–44)
AST: 29 U/L (ref 15–41)
Albumin: 4.6 g/dL (ref 3.5–5.0)
Alkaline Phosphatase: 82 U/L (ref 38–126)
Anion gap: 14 (ref 5–15)
BUN: 12 mg/dL (ref 6–20)
CO2: 25 mmol/L (ref 22–32)
Calcium: 9.1 mg/dL (ref 8.9–10.3)
Chloride: 103 mmol/L (ref 98–111)
Creatinine, Ser: 0.5 mg/dL (ref 0.44–1.00)
GFR, Estimated: >60 mL/min (ref >60)
Glucose, Bld: 99 mg/dL (ref 70–99)
Potassium: 3.6 mmol/L (ref 3.5–5.1)
Sodium: 142 mmol/L (ref 135–145)
Total Bilirubin: 0.4 mg/dL (ref 0.0–1.2)
Total Protein: 7.1 g/dL (ref 6.5–8.1)

## 2024-05-03 LAB — URINALYSIS, ROUTINE W REFLEX MICROSCOPIC
Bacteria, UA: NONE SEEN
Bilirubin Urine: NEGATIVE
Glucose, UA: NEGATIVE mg/dL
Ketones, ur: NEGATIVE mg/dL
Leukocytes,Ua: NEGATIVE
Nitrite: NEGATIVE
Protein, ur: NEGATIVE mg/dL
Specific Gravity, Urine: 1.004 — ABNORMAL LOW (ref 1.005–1.030)
pH: 6 (ref 5.0–8.0)

## 2024-05-03 NOTE — Discharge Instructions (Signed)
 Start taking your hydralazine  3 times a day unless your blood pressure is below 120 systolic.  Follow-up with your doctors next week.  Return if any problems

## 2024-05-03 NOTE — ED Triage Notes (Signed)
 Pt complains of elevated blood pressure for 2 days. Pt states was not able to sleep well last night but denies any pain. Felt like nose was stopped up and unable to breath when laying flat to sleep last night.

## 2024-05-04 NOTE — ED Provider Notes (Signed)
 [place] EM[facility]GENCY DEPARTMENT AT [facility] Provider Note   CSN: [id number] Arrival date & time: 05/03/24  1755     Patient presents with: Hypertension   Wendy Ramirez is a 58 y.o. female.   Patient states that her blood pressure has been running high.  She is on Cozaar  and metoprolol .  She is to take hydralazine  if blood pressure gets above 180.  Patient having no symptoms presently  The history is provided by the patient and medical records. No language interpreter was used.  Hypertension This is a recurrent problem. The current episode started 6 to 12 hours ago. The problem occurs constantly. The problem has not changed since onset.Pertinent negatives include no chest pain, no abdominal pain and no headaches. Nothing aggravates the symptoms. Nothing relieves the symptoms. She has tried nothing for the symptoms.       Prior to Admission medications   Medication Sig Start Date End Date Taking? Authorizing Provider  hydrALAZINE  (APRESOLINE ) 25 MG tablet Take 1 tablet (25 mg total) by mouth 3 (three) times daily as needed (for systolic blood pressure >185). 01/17/24   Barrett, Jamie N, PA-C  HYDROcodone -acetaminophen  (NORCO) 7.5-325 MG tablet Take 1 tablet by mouth every 6 (six) hours as needed for moderate pain (pain score 4-6) (Must last 30 days). 02/23/24   Brenna Lin, MD  losartan  (COZAAR ) 50 MG tablet TAKE 1 TABLET(50 MG) BY MOUTH DAILY 03/15/24   Alvan Dorn FALCON, MD  metoprolol  tartrate (LOPRESSOR ) 100 MG tablet Take 1 tablet (100 mg total) by mouth 2 (two) times daily. 10/20/23   Alvan Dorn FALCON, MD    Allergies: Patient has no known allergies.    Review of Systems  Constitutional:  Negative for appetite change and fatigue.  HENT:  Negative for congestion, ear discharge and sinus pressure.   Eyes:  Negative for discharge.  Respiratory:  Negative for cough.   Cardiovascular:  Negative for chest pain.  Gastrointestinal:  Negative for abdominal pain and  diarrhea.  Genitourinary:  Negative for frequency and hematuria.  Musculoskeletal:  Negative for back pain.  Skin:  Negative for rash.  Neurological:  Negative for seizures and headaches.  Psychiatric/Behavioral:  Negative for hallucinations.     Updated Vital Signs BP (!) 148/88 (BP Location: Right Arm)   Pulse 70   Temp 97.9 F (36.6 C) (Oral)   Resp 17   Wt 106 kg   LMP 05/23/2013   SpO2 98%   BMI 36.60 kg/m   Physical Exam Vitals and nursing note reviewed.  Constitutional:      Appearance: She is well-developed.  HENT:     Head: Normocephalic.     Nose: Nose normal.  Eyes:     General: No scleral icterus.    Conjunctiva/sclera: Conjunctivae normal.  Neck:     Thyroid : No thyromegaly.  Cardiovascular:     Rate and Rhythm: Normal rate and regular rhythm.     Heart sounds: No murmur heard.    No friction rub. No gallop.  Pulmonary:     Breath sounds: No stridor. No wheezing or rales.  Chest:     Chest wall: No tenderness.  Abdominal:     General: There is no distension.     Tenderness: There is no abdominal tenderness. There is no rebound.  Musculoskeletal:        General: Normal range of motion.     Cervical back: Neck supple.  Lymphadenopathy:     Cervical: No cervical adenopathy.  Skin:  Findings: No erythema or rash.  Neurological:     Mental Status: She is alert and oriented to person, place, and time.     Motor: No abnormal muscle tone.     Coordination: Coordination normal.  Psychiatric:        Behavior: Behavior normal.     (all labs ordered are listed, but only abnormal results are displayed) Labs Reviewed  CBC - Abnormal; Notable for the following components:      Result Value   Hemoglobin 15.6 (*)    HCT 48.1 (*)    All other components within normal limits  URINALYSIS, ROUTINE W REFLEX MICROSCOPIC - Abnormal; Notable for the following components:   Color, Urine STRAW (*)    Specific Gravity, Urine 1.004 (*)    Hgb urine dipstick  MODERATE (*)    All other components within normal limits  COMPREHENSIVE METABOLIC PANEL WITH GFR    EKG: None  Radiology: No results found.   Procedures   Medications Ordered in the ED - No data to display                                  Medical Decision Making Amount and/or Complexity of Data Reviewed Labs: ordered.   Patient with poorly controlled hypertension.  She is told to start taking her hydralazine  25 mg 3 times a day unless her systolic is below 120.  She is to follow-up with her PCP     Final diagnoses:  Primary hypertension    ED Discharge Orders     None          Suzette Pac, MD 05/04/24 1225

## 2024-05-31 ENCOUNTER — Other Ambulatory Visit: Payer: Self-pay

## 2024-05-31 ENCOUNTER — Encounter (HOSPITAL_COMMUNITY): Payer: Self-pay | Admitting: *Deleted

## 2024-05-31 ENCOUNTER — Emergency Department (HOSPITAL_COMMUNITY)
Admission: EM | Admit: 2024-05-31 | Discharge: 2024-05-31 | Disposition: A | Discharge status: Home / Self Care | Payer: Self-pay

## 2024-05-31 DIAGNOSIS — Z79899 Other long term (current) drug therapy: Secondary | ICD-10-CM | POA: Insufficient documentation

## 2024-05-31 DIAGNOSIS — I10 Essential (primary) hypertension: Secondary | ICD-10-CM | POA: Insufficient documentation

## 2024-05-31 LAB — CBC WITH DIFFERENTIAL/PLATELET
Abs Immature Granulocytes: 0.03 K/uL (ref 0.00–0.07)
Basophils Absolute: 0.1 K/uL (ref 0.0–0.1)
Basophils Relative: 1 %
Eosinophils Absolute: 0.1 K/uL (ref 0.0–0.5)
Eosinophils Relative: 1 %
HCT: 48.3 % — ABNORMAL HIGH (ref 36.0–46.0)
Hemoglobin: 15.8 g/dL — ABNORMAL HIGH (ref 12.0–15.0)
Immature Granulocytes: 0 %
Lymphocytes Relative: 24 %
Lymphs Abs: 1.9 K/uL (ref 0.7–4.0)
MCH: 31.2 pg (ref 26.0–34.0)
MCHC: 32.7 g/dL (ref 30.0–36.0)
MCV: 95.5 fL (ref 80.0–100.0)
Monocytes Absolute: 0.5 K/uL (ref 0.1–1.0)
Monocytes Relative: 7 %
Neutro Abs: 5 K/uL (ref 1.7–7.7)
Neutrophils Relative %: 67 %
Platelets: 220 K/uL (ref 150–400)
RBC: 5.06 MIL/uL (ref 3.87–5.11)
RDW: 11.9 % (ref 11.5–15.5)
WBC: 7.6 K/uL (ref 4.0–10.5)
nRBC: 0 % (ref 0.0–0.2)

## 2024-05-31 LAB — TROPONIN T, HIGH SENSITIVITY: Troponin T High Sensitivity: <15 ng/L (ref 0–19)

## 2024-05-31 LAB — BASIC METABOLIC PANEL WITH GFR
Anion gap: 12 (ref 5–15)
BUN: 13 mg/dL (ref 6–20)
CO2: 26 mmol/L (ref 22–32)
Calcium: 9.3 mg/dL (ref 8.9–10.3)
Chloride: 104 mmol/L (ref 98–111)
Creatinine, Ser: 0.56 mg/dL (ref 0.44–1.00)
GFR, Estimated: >60 mL/min
Glucose, Bld: 104 mg/dL — ABNORMAL HIGH (ref 70–99)
Potassium: 4.3 mmol/L (ref 3.5–5.1)
Sodium: 142 mmol/L (ref 135–145)

## 2024-05-31 NOTE — ED Triage Notes (Signed)
 Pt noted elevated BP at home since yesterday. One reading at 200/100. Denies any HA, c/o heaviness to chest with lying down.

## 2024-05-31 NOTE — Discharge Instructions (Signed)
 Discussed your high blood pressure with your primary care doctor.  Follow-up with them at your scheduled appointment in 2 days.

## 2024-05-31 NOTE — ED Provider Notes (Signed)
 [place] [facility] AT [facility] Provider Note   CSN: [id number] Arrival date & time: 05/31/24  1618     Patient presents with: Hypertension   Wendy Ramirez is a 59 y.o. female.   59 year old female presents for evaluation of elevated blood pressure.  She states this is gone for a few days.  States earlier it was 200/100.  She has a follow-up with her primary care doctor in 2 days.  States she has been using nasal spray and Mucinex for congestion and thought it might be recent blood pressure so she stopped it.  States she still at nighttime has some heaviness in her chest.  Since she denies any other symptoms or concerns at this time.   Hypertension Associated symptoms include chest pain. Pertinent negatives include no abdominal pain and no shortness of breath.       Prior to Admission medications  Medication Sig Start Date End Date Taking? Authorizing Provider  hydrALAZINE  (APRESOLINE ) 25 MG tablet Take 1 tablet (25 mg total) by mouth 3 (three) times daily as needed (for systolic blood pressure >185). 01/17/24   Barrett, Warren SAILOR, PA-C  HYDROcodone -acetaminophen  (NORCO) 7.5-325 MG tablet Take 1 tablet by mouth every 6 (six) hours as needed for moderate pain (pain score 4-6) (Must last 30 days). 02/23/24   Brenna Lin, MD  losartan  (COZAAR ) 50 MG tablet TAKE 1 TABLET(50 MG) BY MOUTH DAILY 03/15/24   Alvan Dorn FALCON, MD  metoprolol  tartrate (LOPRESSOR ) 100 MG tablet Take 1 tablet (100 mg total) by mouth 2 (two) times daily. 10/20/23   Alvan Dorn FALCON, MD    Allergies: Patient has no known allergies.    Review of Systems  Constitutional:  Negative for chills and fever.  HENT:  Negative for ear pain and sore throat.   Eyes:  Negative for pain and visual disturbance.  Respiratory:  Negative for cough and shortness of breath.   Cardiovascular:  Positive for chest pain. Negative for palpitations.  Gastrointestinal:  Negative for abdominal pain and  vomiting.  Genitourinary:  Negative for dysuria and hematuria.  Musculoskeletal:  Negative for arthralgias and back pain.  Skin:  Negative for color change and rash.  Neurological:  Negative for seizures and syncope.  All other systems reviewed and are negative.   Updated Vital Signs BP (!) 164/94 (BP Location: Right Arm)   Pulse 64   Temp 98.2 F (36.8 C) (Oral)   Resp 16   Ht 5' 7 (1.702 m)   Wt 106.6 kg   LMP 05/23/2013   SpO2 97%   BMI 36.81 kg/m   Physical Exam Vitals and nursing note reviewed.  Constitutional:      General: She is not in acute distress.    Appearance: Normal appearance. She is well-developed. She is not ill-appearing.  HENT:     Head: Normocephalic and atraumatic.  Eyes:     Conjunctiva/sclera: Conjunctivae normal.  Cardiovascular:     Rate and Rhythm: Normal rate and regular rhythm.     Heart sounds: No murmur heard. Pulmonary:     Effort: Pulmonary effort is normal. No respiratory distress.     Breath sounds: Normal breath sounds.  Abdominal:     Palpations: Abdomen is soft.     Tenderness: There is no abdominal tenderness.  Musculoskeletal:        General: No swelling.     Cervical back: Neck supple.  Skin:    General: Skin is warm and dry.  Capillary Refill: Capillary refill takes less than 2 seconds.  Neurological:     General: No focal deficit present.     Mental Status: She is alert.  Psychiatric:        Mood and Affect: Mood normal.     (all labs ordered are listed, but only abnormal results are displayed) Labs Reviewed  CBC WITH DIFFERENTIAL/PLATELET - Abnormal; Notable for the following components:      Result Value   Hemoglobin 15.8 (*)    HCT 48.3 (*)    All other components within normal limits  BASIC METABOLIC PANEL WITH GFR - Abnormal; Notable for the following components:   Glucose, Bld 104 (*)    All other components within normal limits  TROPONIN T, HIGH SENSITIVITY    EKG: EKG  Interpretation Date/Time:  Tuesday May 31 2024 16:44:46 EST Ventricular Rate:  68 PR Interval:  196 QRS Duration:  76 QT Interval:  428 QTC Calculation: 455 R Axis:   -12  Text Interpretation: Normal sinus rhythm  Compared with prior EKG from 05/03/2024 Confirmed by Gennaro Bouchard ([id number]) on 05/31/2024 8:49:25 PM  Radiology: No results found.   Procedures   Medications Ordered in the ED - No data to display                                  Medical Decision Making Patient's blood pressure improved without any intervention here.  She is asymptomatic with negative lab work, including negative troponin, negative EKG and chest x-ray.  She has a follow-up with primary care in 2 days.  She otherwise appears stable.  Advised Tylenol  as needed for pain and to go to her appointment.  Advised to return for new or worsening symptoms and also to follow-up with her cardiologist.  She feels comfortable to plan to be discharged home.  Problems Addressed: Hypertension, unspecified type: acute illness or injury  Amount and/or Complexity of Data Reviewed External Data Reviewed: notes.    Details: Prior ED records reviewed and patient seen 05-03-2024 for elevated blood pressure Labs: ordered. Decision-making details documented in ED Course.    Details: Ordered and reviewed by me and unremarkable including negative troponin Radiology: ordered and independent interpretation performed. Decision-making details documented in ED Course.    Details: Ordered and interpreted by me independently of radiology Chest x-ray: Shows no acute abnormality in the chest ECG/medicine tests: ordered and independent interpretation performed. Decision-making details documented in ED Course. Discussion of management or test interpretation with external provider(s): Ordered and interpreted by me in the absence of cardiology and shows sinus rhythm, no STEMI, or significant change when compared to prior EKG  Risk OTC  drugs. Prescription drug management.     Final diagnoses:  Hypertension, unspecified type    ED Discharge Orders     None          Gennaro Bouchard CROME, DO 05/31/24 2129  "

## 2024-06-02 ENCOUNTER — Ambulatory Visit: Payer: Self-pay | Attending: Nurse Practitioner | Admitting: Nurse Practitioner

## 2024-06-02 ENCOUNTER — Encounter: Payer: Self-pay | Admitting: Nurse Practitioner

## 2024-06-02 VITALS — BP 191/101 | HR 75 | Ht 67.0 in | Wt 245.2 lb

## 2024-06-02 DIAGNOSIS — I10 Essential (primary) hypertension: Secondary | ICD-10-CM

## 2024-06-02 DIAGNOSIS — I480 Paroxysmal atrial fibrillation: Secondary | ICD-10-CM

## 2024-06-02 DIAGNOSIS — Z79899 Other long term (current) drug therapy: Secondary | ICD-10-CM

## 2024-06-02 DIAGNOSIS — E669 Obesity, unspecified: Secondary | ICD-10-CM

## 2024-06-02 MED ORDER — LOSARTAN POTASSIUM 100 MG PO TABS: 100.0000 mg | ORAL_TABLET | Freq: Every day | ORAL | 6 refills | Status: AC

## 2024-06-02 NOTE — Patient Instructions (Addendum)
 Medication Instructions:   Increase Losartan  to 100mg  daily Continue all other medications.     Labwork:  BMET, TSH, HgA1c, Aldosterone/Renin ration - orders given today Please do in 1 week   Testing/Procedures:  none  Follow-Up:  4 weeks   Any Other Special Instructions Will Be Listed Below (If Applicable).  Update office in 1 week with BP readings   If you need a refill on your cardiac medications before your next appointment, please call your pharmacy.

## 2024-06-02 NOTE — Progress Notes (Unsigned)
 " Cardiology Office Note:  .   Date: 06/02/2024 ID:  Wendy Ramirez Born, DOB 10-16-1965, MRN [id number] PCP: Freddrick Johns  [facility] Providers Cardiologist:  Alvan Carrier, MD    History of Present Illness: .   Wendy Ramirez is a 59 y.o. female with a PMH of PAF, hypertension, and past history of hair loss, who presents today for HTN evaluation.   Last seen by Dr. Carrier Alvan on October 30, 2022.  Eliquis  was started for her in the past due to chads Vascor of 2.  However, Eliquis  was too expensive, was referred to Coumadin  clinic.  She was in normal sinus rhythm on exam that day.  Losartan  was increased to 50 mg daily due to elevated BP.  09/18/2023 - Today she presents for follow-up.  She states she is overall doing well. Denies any chest pain, shortness of breath, palpitations, syncope, presyncope, dizziness, orthopnea, PND, swelling or significant weight changes, acute bleeding, or claudication.   Since last office visit, she has had several ED visits for hypertension.  06/02/2024 - She is here for follow-up and for HTN management. Admits to elevated readings, sometimes has noticed SBP readings in the 200's.  She admits to labile BP readings at times and she can tell when her BP is elevated as she feels hot.  She is compliant with her medications. Denies any chest pain, shortness of breath, palpitations, syncope, presyncope, dizziness, orthopnea, PND, swelling or significant weight changes, acute bleeding, or claudication.   ROS: Negative. See HPI.  SH: Takes care of 23 year old mother. Has grandchildren ranging in age from 63 -27 years old. Enjoys supporting grandchildren at cheerleading and football games.   Studies Reviewed: SABRA    EKG: EKG is not ordered today.   Physical Exam:   VS:  BP (!) 191/101   Pulse 75   Ht 5' 7 (1.702 m)   Wt 245 lb 3.2 oz (111.2 kg)   LMP 05/23/2013   SpO2 95%   BMI 38.40 kg/m    Wt Readings from Last 3 Encounters:  06/02/24 245 lb 3.2 oz (111.2  kg)  05/31/24 235 lb (106.6 kg)  05/03/24 233 lb 11 oz (106 kg)    GEN: Obese, 59 y.o. female in no acute distress NECK: No JVD; No carotid bruits CARDIAC: S1/S2, RRR, no murmurs, rubs, gallops RESPIRATORY:  Clear to auscultation without rales, wheezing or rhonchi  ABDOMEN: Soft, non-tender, non-distended EXTREMITIES:  No edema; No deformity   ASSESSMENT AND PLAN: .    HTN, medication management SBP elevated today. Discussed to monitor BP at home at least 2 hours after medications and sitting for 5-10 minutes. Wendy increase losartan  to 100 mg daily. Continue rest of medication regimen. Heart healthy diet and regular cardiovascular exercise encouraged.  If blood pressure is not improved by next office visit, plan to stop metoprolol  and switch to carvedilol.  Wendy obtain the following labs for further evaluation: BMET, TSH, A1c, and aldosterone/renin ratio.  She Wendy update us  in 1 week with her BP readings.  2. PAF Denies any tachycardia or palpitations. HR is well controlled today. She is not on any OAC. I discussed her stroke risk and risks of not being on OAC. She verbalized understanding. Says she wants to wait and discuss this when her fiances/insurance is in order. I told her to contact our office when she is ready. She verbalized understanding. Continue Lopressor .   3. Obesity Weight loss via diet and exercise encouraged. Discussed the  impact being overweight would have on cardiovascular risk.  I discussed several times about the importance of seeing a primary care provider.  Patient admits to current insurance issues and is requesting to defer at this time.     Dispo: Follow-up with me/APP in 4 weeks or sooner if anything changes.   Signed, Almarie Crate, NP   "

## 2024-06-16 ENCOUNTER — Other Ambulatory Visit: Payer: Self-pay

## 2024-06-16 ENCOUNTER — Encounter (HOSPITAL_COMMUNITY): Payer: Self-pay

## 2024-06-16 ENCOUNTER — Emergency Department (HOSPITAL_COMMUNITY)
Admission: EM | Admit: 2024-06-16 | Discharge: 2024-06-16 | Disposition: A | Discharge status: Home / Self Care | Payer: Self-pay | Attending: Emergency Medicine | Admitting: Emergency Medicine

## 2024-06-16 ENCOUNTER — Other Ambulatory Visit (HOSPITAL_COMMUNITY)
Admission: RE | Admit: 2024-06-16 | Discharge: 2024-06-16 | Disposition: A | Discharge status: Home / Self Care | Payer: Self-pay | Source: Ambulatory Visit | Attending: Nurse Practitioner | Admitting: Nurse Practitioner

## 2024-06-16 DIAGNOSIS — Z79899 Other long term (current) drug therapy: Secondary | ICD-10-CM | POA: Insufficient documentation

## 2024-06-16 DIAGNOSIS — E669 Obesity, unspecified: Secondary | ICD-10-CM | POA: Insufficient documentation

## 2024-06-16 DIAGNOSIS — D751 Secondary polycythemia: Secondary | ICD-10-CM | POA: Insufficient documentation

## 2024-06-16 DIAGNOSIS — I10 Essential (primary) hypertension: Secondary | ICD-10-CM | POA: Insufficient documentation

## 2024-06-16 DIAGNOSIS — R7309 Other abnormal glucose: Secondary | ICD-10-CM | POA: Insufficient documentation

## 2024-06-16 DIAGNOSIS — J00 Acute nasopharyngitis [common cold]: Secondary | ICD-10-CM | POA: Insufficient documentation

## 2024-06-16 DIAGNOSIS — R739 Hyperglycemia, unspecified: Secondary | ICD-10-CM

## 2024-06-16 DIAGNOSIS — R002 Palpitations: Secondary | ICD-10-CM | POA: Insufficient documentation

## 2024-06-16 DIAGNOSIS — Z87891 Personal history of nicotine dependence: Secondary | ICD-10-CM | POA: Insufficient documentation

## 2024-06-16 LAB — CBC WITH DIFFERENTIAL/PLATELET
Abs Immature Granulocytes: 0.01 K/uL (ref 0.00–0.07)
Basophils Absolute: 0 K/uL (ref 0.0–0.1)
Basophils Relative: 1 %
Eosinophils Absolute: 0.1 K/uL (ref 0.0–0.5)
Eosinophils Relative: 2 %
HCT: 48.9 % — ABNORMAL HIGH (ref 36.0–46.0)
Hemoglobin: 15.7 g/dL — ABNORMAL HIGH (ref 12.0–15.0)
Immature Granulocytes: 0 %
Lymphocytes Relative: 27 %
Lymphs Abs: 1.6 K/uL (ref 0.7–4.0)
MCH: 30.7 pg (ref 26.0–34.0)
MCHC: 32.1 g/dL (ref 30.0–36.0)
MCV: 95.7 fL (ref 80.0–100.0)
Monocytes Absolute: 0.7 K/uL (ref 0.1–1.0)
Monocytes Relative: 11 %
Neutro Abs: 3.5 K/uL (ref 1.7–7.7)
Neutrophils Relative %: 59 %
Platelets: 189 K/uL (ref 150–400)
RBC: 5.11 MIL/uL (ref 3.87–5.11)
RDW: 11.9 % (ref 11.5–15.5)
WBC: 5.9 K/uL (ref 4.0–10.5)
nRBC: 0 % (ref 0.0–0.2)

## 2024-06-16 LAB — BASIC METABOLIC PANEL WITH GFR
Anion gap: 14 (ref 5–15)
Anion gap: 13 (ref 5–15)
BUN: 18 mg/dL (ref 6–20)
BUN: 12 mg/dL (ref 6–20)
CO2: 24 mmol/L (ref 22–32)
CO2: 23 mmol/L (ref 22–32)
Calcium: 8.9 mg/dL (ref 8.9–10.3)
Calcium: 8.7 mg/dL — ABNORMAL LOW (ref 8.9–10.3)
Chloride: 102 mmol/L (ref 98–111)
Chloride: 105 mmol/L (ref 98–111)
Creatinine, Ser: 0.78 mg/dL (ref 0.44–1.00)
Creatinine, Ser: 0.5 mg/dL (ref 0.44–1.00)
GFR, Estimated: >60 mL/min
GFR, Estimated: >60 mL/min
Glucose, Bld: 107 mg/dL — ABNORMAL HIGH (ref 70–99)
Glucose, Bld: 98 mg/dL (ref 70–99)
Potassium: 4.3 mmol/L (ref 3.5–5.1)
Potassium: 4.9 mmol/L (ref 3.5–5.1)
Sodium: 140 mmol/L (ref 135–145)
Sodium: 141 mmol/L (ref 135–145)

## 2024-06-16 LAB — HEMOGLOBIN A1C
Hgb A1c MFr Bld: 5.7 % — ABNORMAL HIGH (ref 4.8–5.6)
Mean Plasma Glucose: 116.89 mg/dL

## 2024-06-16 LAB — TSH: TSH: 1.21 u[IU]/mL (ref 0.350–4.500)

## 2024-06-16 MED ORDER — FLUTICASONE PROPIONATE 50 MCG/ACT NA SUSP: 2.0000 | Freq: Every day | NASAL | 2 refills | Status: AC

## 2024-06-16 NOTE — ED Provider Notes (Signed)
 " [place] EMERGENCY DEPARTMENT AT [facility] Provider Note   CSN: [id number] Arrival date & time: 06/16/24  9940     Patient presents with: Palpitations   Wendy Ramirez is a 59 y.o. female.   The history is provided by the patient.  Palpitations  She has history of hypertension, paroxysmal atrial fibrillation not on anticoagulation because of personal preference and comes in because of a sense that her heart is fluttering in her chest.  This happens at night and that has happened for the last several nights.  She denies chest pain, heaviness, tightness, pressure.  She denies dyspnea, nausea, vomiting.  Of note, she has had a cold recently with nasal congestion and minimal cough.  She has been using a variety of over-the-counter medications without any improvement in her nasal congestion.  She is a former smoker, but switch to vaping 1 year ago.  Also, she had her blood pressure medication adjusted about 10 days ago.  She states she has been compliant with her blood pressure medication.  Blood pressure tends to be normal in the morning and high in the evening.    Prior to Admission medications  Medication Sig Start Date End Date Taking? Authorizing Provider  Buprenorphine HCl-Naloxone HCl 8-2 MG FILM Place under the tongue daily. 05/31/24   [provider]  cloNIDine  (CATAPRES ) 0.1 MG tablet Take 0.1 mg by mouth every 8 (eight) hours as needed. 05/09/24   [provider]  losartan  (COZAAR ) 100 MG tablet Take 1 tablet (100 mg total) by mouth daily. 06/02/24   Miriam Norris, NP  metoprolol  tartrate (LOPRESSOR ) 100 MG tablet Take 1 tablet (100 mg total) by mouth 2 (two) times daily. 10/20/23   Alvan Dorn FALCON, MD    Allergies: Patient has no known allergies.    Review of Systems  Cardiovascular:  Positive for palpitations.  All other systems reviewed and are negative.   Updated Vital Signs BP (!) 212/99   Pulse 77   Temp 98.4 F (36.9 C) (Oral)    Resp 11   Ht 5' 7 (1.702 m)   Wt 104.3 kg   LMP 05/23/2013   SpO2 96%   BMI 36.02 kg/m   Physical Exam Vitals and nursing note reviewed.   59 year old female, resting comfortably and in no acute distress. Vital signs are significant for elevated blood pressure. Oxygen saturation is 96%, which is normal. Head is normocephalic and atraumatic. PERRLA, EOMI. Oropharynx is clear. Lungs are clear without rales, wheezes, or rhonchi. Heart has regular rate and rhythm without murmur. Abdomen is soft, flat, nontender. Extremities have no cyanosis or edema, full range of motion is present. Skin is warm and dry without rash. Neurologic: Awake and alert, moves all extremities equally.  (all labs ordered are listed, but only abnormal results are displayed) Labs Reviewed - No data to display  EKG: EKG Interpretation Date/Time:  Thursday June 16 2024 01:11:25 EST Ventricular Rate:  77 PR Interval:  192 QRS Duration:  96 QT Interval:  388 QTC Calculation: 440 R Axis:   -10  Text Interpretation: Sinus rhythm Normal ECG When compared with ECG of 05/31/2024, No significant change was found Confirmed by Raford Lenis ([id number]) on 06/16/2024 1:16:46 AM  Radiology: No results found.  Cardiac monitor shows normal sinus rhythm, per my interpretation.  Procedures   Medications Ordered in the ED - No data to display  Medical Decision Making Amount and/or Complexity of Data Reviewed Labs: ordered.   Palpitations.  Differential diagnosis includes, but is not limited to, paroxysmal atrial fibrillation, paroxysmal supraventricular tachycardia, PVCs, PACs.  Monitor shows normal sinus rhythm.  Patient states that she feels like her heart is fluttering in her chest now even with the heart monitor showing sinus rhythm.  I have reviewed her electrocardiogram, my interpretation is normal ECG.  Cause for her sensation is not clear but no evidence of recurrent atrial  fibrillation.  I have ordered screening labs.  I have reviewed her past records and do note office visit on 06/02/2024 at which time losartan  was increased to 100 mg daily.  Blood pressure was repeated, and has come down to 176 systolic without any treatment.  I have reviewed her laboratory tests, my interpretation is borderline elevated random glucose level, stable polycythemia.  I have reassured her that her sense of palpitations are not related to actual heart rhythm problem.  I have ordered a prescription for Flonase  for her upper respiratory infection and referred her back to her cardiologist.     Final diagnoses:  Palpitations  Elevated random blood glucose level  Polycythemia  Acute rhinitis    ED Discharge Orders          Ordered    Ambulatory referral to Cardiology       Comments: If you have not heard from the Cardiology office within the next 72 hours please call [phone].   06/16/24 0331    fluticasone  (FLONASE ) 50 MCG/ACT nasal spray  Daily        06/16/24 0337               Raford Lenis, MD 06/16/24 2243  "

## 2024-06-16 NOTE — ED Triage Notes (Signed)
 Pov from home. Cc of palpitations that only happen at night. Doesn't happen every night but happened tonight. Been going on for appx a month.  Denies pain or SOB.  Just that it scares her

## 2024-06-16 NOTE — Discharge Instructions (Addendum)
 Your evaluation did not show any abnormality in your heartbeat.  It is not clear what is causing that sensation in your chest.  I do recommend that you follow-up with your cardiologist and consider getting an outpatient cardiac monitor to make sure you are not having any dangerous heart rhythms occurring.  You may want to consider getting t an app for your phone like [facility] .  You can use them to check your heart rhythm at any time.  You may use Flonase  nasal spray to try to help with your nasal congestion.  This is safe to use with high blood pressure.

## 2024-06-24 LAB — ALDOSTERONE + RENIN ACTIVITY W/ RATIO
ALDO / PRA Ratio: 7.1 (ref 0.0–20.0)
Aldosterone: 1.9 ng/dL (ref 0.0–30.0)
PRA LC/MS/MS: 0.268 ng/mL/h (ref 0.167–5.380)

## 2024-06-27 ENCOUNTER — Ambulatory Visit: Payer: Self-pay | Admitting: Nurse Practitioner

## 2024-06-28 NOTE — Telephone Encounter (Signed)
 The patient has been notified of the result and verbalized understanding.  All questions (if any) were answered. Patient stated that she will bring her BP log with her on her next visit with Almarie. Stated her BP has been more elevated during the night. Sent message to provider so she is aware Littie CHRISTELLA Croak, CMA 06/28/2024 9:30 AM

## 2024-06-30 ENCOUNTER — Encounter: Payer: Self-pay | Admitting: Nurse Practitioner

## 2024-07-04 ENCOUNTER — Ambulatory Visit: Payer: Self-pay | Admitting: Nurse Practitioner
